# Patient Record
Sex: Female | Born: 1955 | Race: Black or African American | Hispanic: No | Marital: Married | Smoking: Former smoker
Health system: Southern US, Community
[De-identification: ages and names within clinical notes are randomized; demographics above are authoritative.]

## PROBLEM LIST (undated history)

## (undated) DIAGNOSIS — R0600 Dyspnea, unspecified: Secondary | ICD-10-CM

## (undated) DIAGNOSIS — Z87898 Personal history of other specified conditions: Secondary | ICD-10-CM

## (undated) DIAGNOSIS — Z1211 Encounter for screening for malignant neoplasm of colon: Secondary | ICD-10-CM

## (undated) DIAGNOSIS — M7582 Other shoulder lesions, left shoulder: Secondary | ICD-10-CM

## (undated) HISTORY — DX: Other shoulder lesions, left shoulder: M75.82

## (undated) HISTORY — DX: Encounter for screening for malignant neoplasm of colon: Z12.11

## (undated) HISTORY — PX: APPENDECTOMY: SHX54

## (undated) HISTORY — DX: Personal history of other specified conditions: Z87.898

## (undated) HISTORY — PX: TUBAL LIGATION: SHX77

---

## 2004-09-09 ENCOUNTER — Ambulatory Visit: Payer: Self-pay | Admitting: Endocrinology

## 2004-11-06 ENCOUNTER — Ambulatory Visit: Payer: Self-pay | Admitting: General Practice

## 2005-03-19 ENCOUNTER — Ambulatory Visit: Payer: Self-pay

## 2005-12-09 ENCOUNTER — Ambulatory Visit: Payer: Self-pay | Admitting: General Practice

## 2006-01-10 ENCOUNTER — Emergency Department: Payer: Self-pay | Admitting: Internal Medicine

## 2010-08-21 ENCOUNTER — Ambulatory Visit: Payer: Self-pay | Admitting: General Practice

## 2011-03-27 ENCOUNTER — Emergency Department: Payer: Self-pay | Admitting: Emergency Medicine

## 2011-08-26 ENCOUNTER — Ambulatory Visit: Payer: Self-pay | Admitting: General Practice

## 2011-08-28 ENCOUNTER — Ambulatory Visit: Payer: Self-pay | Admitting: General Practice

## 2012-09-15 ENCOUNTER — Ambulatory Visit: Payer: Self-pay | Admitting: Medical

## 2013-03-02 ENCOUNTER — Ambulatory Visit: Payer: Self-pay | Admitting: Medical

## 2013-09-21 ENCOUNTER — Ambulatory Visit: Payer: Self-pay | Admitting: Medical

## 2014-09-27 ENCOUNTER — Ambulatory Visit: Admit: 2014-09-27 | Disposition: A | Discharge status: Home / Self Care | Payer: Self-pay | Admitting: Medical

## 2015-10-18 DIAGNOSIS — Z87898 Personal history of other specified conditions: Secondary | ICD-10-CM

## 2015-10-18 HISTORY — DX: Personal history of other specified conditions: Z87.898

## 2016-09-18 ENCOUNTER — Other Ambulatory Visit: Payer: Self-pay

## 2016-09-18 DIAGNOSIS — Z0000 Encounter for general adult medical examination without abnormal findings: Secondary | ICD-10-CM

## 2016-09-19 LAB — CMP12+LP+TP+TSH+6AC+CBC/D/PLT
ALBUMIN: 4.2 g/dL (ref 3.6–4.8)
ALK PHOS: 94 IU/L (ref 39–117)
ALT: 13 IU/L (ref 0–32)
AST: 15 IU/L (ref 0–40)
Albumin/Globulin Ratio: 1.6 (ref 1.2–2.2)
BASOS: 1 %
BUN/Creatinine Ratio: 30 — ABNORMAL HIGH (ref 12–28)
BUN: 22 mg/dL (ref 8–27)
Basophils Absolute: 0 10*3/uL (ref 0.0–0.2)
Bilirubin Total: <0.2 mg/dL (ref 0.0–1.2)
CALCIUM: 9.8 mg/dL (ref 8.7–10.3)
CHLORIDE: 102 mmol/L (ref 96–106)
CHOL/HDL RATIO: 4.4 ratio (ref 0.0–4.4)
CHOLESTEROL TOTAL: 201 mg/dL — AB (ref 100–199)
Creatinine, Ser: 0.73 mg/dL (ref 0.57–1.00)
EOS (ABSOLUTE): 0.1 10*3/uL (ref 0.0–0.4)
ESTIMATED CHD RISK: 1 times avg. (ref 0.0–1.0)
Eos: 2 %
FREE THYROXINE INDEX: 3.3 (ref 1.2–4.9)
GFR calc Af Amer: 104 mL/min/{1.73_m2} (ref >59)
GFR calc non Af Amer: 90 mL/min/{1.73_m2} (ref >59)
GGT: 13 IU/L (ref 0–60)
Globulin, Total: 2.7 g/dL (ref 1.5–4.5)
Glucose: 87 mg/dL (ref 65–99)
HDL: 46 mg/dL (ref >39)
HEMATOCRIT: 46.9 % — AB (ref 34.0–46.6)
Hemoglobin: 15.9 g/dL (ref 11.1–15.9)
IMMATURE GRANULOCYTES: 0 %
Immature Grans (Abs): 0 10*3/uL (ref 0.0–0.1)
Iron: 81 ug/dL (ref 27–159)
LDH: 241 IU/L — ABNORMAL HIGH (ref 119–226)
LDL Calculated: 134 mg/dL — ABNORMAL HIGH (ref 0–99)
LYMPHS ABS: 2.6 10*3/uL (ref 0.7–3.1)
Lymphs: 39 %
MCH: 28.4 pg (ref 26.6–33.0)
MCHC: 33.9 g/dL (ref 31.5–35.7)
MCV: 84 fL (ref 79–97)
MONOS ABS: 0.5 10*3/uL (ref 0.1–0.9)
Monocytes: 7 %
NEUTROS PCT: 51 %
Neutrophils Absolute: 3.5 10*3/uL (ref 1.4–7.0)
PLATELETS: 237 10*3/uL (ref 150–379)
POTASSIUM: 5.1 mmol/L (ref 3.5–5.2)
Phosphorus: 4.5 mg/dL (ref 2.5–4.5)
RBC: 5.6 x10E6/uL — ABNORMAL HIGH (ref 3.77–5.28)
RDW: 15.1 % (ref 12.3–15.4)
SODIUM: 144 mmol/L (ref 134–144)
T3 UPTAKE RATIO: 30 % (ref 24–39)
T4, Total: 11 ug/dL (ref 4.5–12.0)
TSH: 0.943 u[IU]/mL (ref 0.450–4.500)
Total Protein: 6.9 g/dL (ref 6.0–8.5)
Triglycerides: 103 mg/dL (ref 0–149)
URIC ACID: 7.2 mg/dL — AB (ref 2.5–7.1)
VLDL Cholesterol Cal: 21 mg/dL (ref 5–40)
WBC: 6.8 10*3/uL (ref 3.4–10.8)

## 2016-09-19 LAB — VITAMIN D 25 HYDROXY (VIT D DEFICIENCY, FRACTURES): Vit D, 25-Hydroxy: 17 ng/mL — ABNORMAL LOW (ref 30.0–100.0)

## 2016-09-25 ENCOUNTER — Encounter: Payer: Self-pay | Admitting: Medical

## 2016-09-25 ENCOUNTER — Ambulatory Visit: Payer: Self-pay | Admitting: Medical

## 2016-09-25 VITALS — BP 110/72 | HR 87 | Temp 98.7°F | Resp 16 | Ht 67.0 in | Wt 174.0 lb

## 2016-09-25 DIAGNOSIS — M7582 Other shoulder lesions, left shoulder: Secondary | ICD-10-CM

## 2016-09-25 DIAGNOSIS — M778 Other enthesopathies, not elsewhere classified: Secondary | ICD-10-CM

## 2016-09-25 DIAGNOSIS — Z0000 Encounter for general adult medical examination without abnormal findings: Secondary | ICD-10-CM

## 2016-09-25 DIAGNOSIS — M79652 Pain in left thigh: Secondary | ICD-10-CM

## 2016-09-25 NOTE — Progress Notes (Signed)
Patient is here for annual physical to get established as a primary care patient.  Review labs.  Left leg upper thigh/groin area is catching which is hurting.  It has been going on for a few months.  OTC Aleve.

## 2016-09-25 NOTE — Progress Notes (Signed)
   Subjective:    Patient ID: Cindy Yang, female    DOB: 1956-01-15, 61 y.o.   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 Primary care visit.    Review of Systems  Constitutional: Negative.   HENT: Positive for sneezing. Negative for congestion, dental problem, drooling, ear discharge, ear pain, facial swelling, hearing loss, mouth sores, nosebleeds, postnasal drip, rhinorrhea, sinus pain, sinus pressure, sore throat, tinnitus, trouble swallowing and voice change.   Eyes: Negative.   Respiratory: Negative.   Cardiovascular: Negative.   Gastrointestinal: Negative.   Endocrine: Negative.   Genitourinary: Negative.   Musculoskeletal: Negative.   Skin: Negative.   Allergic/Immunologic: Negative.   Neurological: Negative.   Hematological: Negative.   Psychiatric/Behavioral: Negative.     History of left shoulder tendonitis with cortisone injections and physical therapy in the past.with little relief. Before thanksgiving started having left thigh pain,  Only hurts while climbing/walking up steps, nothing makes it feel better, aleve helps some.    Objective:   Physical Exam  Constitutional: She is oriented to person, place, and time. She appears well-developed and well-nourished.  HENT:  Head: Normocephalic and atraumatic.  Right Ear: External ear normal.  Left Ear: External ear normal.  Nose: Nose normal.  Mouth/Throat: Oropharynx is clear and moist.  Eyes: Conjunctivae and EOM are normal. Pupils are equal, round, and reactive to light. No scleral icterus.  Neck: Normal range of motion. Neck supple. No thyromegaly present.  Cardiovascular: Normal rate, regular rhythm and normal heart sounds.  Exam reveals no gallop and no friction rub.   No murmur heard. Pulmonary/Chest: Effort normal and breath sounds normal. She exhibits no mass and no tenderness.  Abdominal: Soft. Normal appearance, normal aorta and bowel sounds are normal. She exhibits no  abdominal bruit. There is no hepatosplenomegaly. There is no tenderness.  Musculoskeletal: She exhibits no edema, tenderness or deformity.  Lymphadenopathy:    She has no cervical adenopathy.  Neurological: She is alert and oriented to person, place, and time.  Skin: Skin is warm and dry.  Psychiatric: She has a normal mood and affect. Her behavior is normal.  Nursing note and vitals reviewed.  Arcus senilis bilateral  Breast exam no discharge, non-tender, no lumps or masses palpated, no adenopathy. can internal and external rotate left hip, denies pain, hurts on flexion or going up steps. Decreased pulses of 1-2 + on DP. PT 2 + bilaterally.    Assessment & Plan:  Primary exam, Left thigh pain ordered xray of left Femur 2 view.  Called patient  10/07/2015 she had not gone yet for the xray, she is going tonight. Elevated LDH 241 Elevated cholesterol 201 Elevated LDL 134 Vitamin  D deficiency 17.0, take OTC Vitamin D3 4000IU/day recheck in one year.  Return to clinic in 4 weeks for recheck.

## 2016-09-29 ENCOUNTER — Other Ambulatory Visit: Payer: Self-pay | Admitting: Medical

## 2016-09-29 DIAGNOSIS — Z1231 Encounter for screening mammogram for malignant neoplasm of breast: Secondary | ICD-10-CM

## 2016-10-01 ENCOUNTER — Encounter: Payer: Self-pay | Admitting: Radiology

## 2016-10-01 ENCOUNTER — Ambulatory Visit
Admission: RE | Admit: 2016-10-01 | Discharge: 2016-10-01 | Disposition: A | Discharge status: Home / Self Care | Payer: BLUE CROSS/BLUE SHIELD | Source: Ambulatory Visit | Attending: Medical | Admitting: Medical

## 2016-10-01 DIAGNOSIS — Z1231 Encounter for screening mammogram for malignant neoplasm of breast: Secondary | ICD-10-CM | POA: Diagnosis present

## 2016-10-06 ENCOUNTER — Ambulatory Visit
Admission: RE | Admit: 2016-10-06 | Discharge: 2016-10-06 | Disposition: A | Discharge status: Home / Self Care | Payer: BLUE CROSS/BLUE SHIELD | Source: Ambulatory Visit | Attending: Medical | Admitting: Medical

## 2016-10-06 ENCOUNTER — Encounter: Payer: Self-pay | Admitting: Medical

## 2016-10-06 DIAGNOSIS — M1612 Unilateral primary osteoarthritis, left hip: Secondary | ICD-10-CM | POA: Diagnosis not present

## 2016-10-06 DIAGNOSIS — M5137 Other intervertebral disc degeneration, lumbosacral region: Secondary | ICD-10-CM | POA: Insufficient documentation

## 2016-10-06 DIAGNOSIS — M79652 Pain in left thigh: Secondary | ICD-10-CM

## 2016-10-07 ENCOUNTER — Encounter: Payer: Self-pay | Admitting: Medical

## 2016-10-07 ENCOUNTER — Telehealth: Payer: Self-pay | Admitting: Medical

## 2016-10-07 DIAGNOSIS — M778 Other enthesopathies, not elsewhere classified: Secondary | ICD-10-CM

## 2016-10-07 DIAGNOSIS — M7582 Other shoulder lesions, left shoulder: Secondary | ICD-10-CM

## 2016-10-07 HISTORY — DX: Other enthesopathies, not elsewhere classified: M77.8

## 2016-10-07 NOTE — Telephone Encounter (Signed)
Called patient on her left  thigh ( based off patient description of pain) bone xray,  Showed  severe degenerative changes noted in the  Left hip . Also reviewed mammogram results negative, with recheck in one year.  Communicated to patient will refer to orthopedics about left  hip changes.

## 2016-12-04 ENCOUNTER — Telehealth: Payer: Self-pay

## 2016-12-04 NOTE — Telephone Encounter (Signed)
Cindy Yang called to let us know she will have a left hip replacement July 3,2018. Dr. Rudene Christians will be performing the surgery. Discussed FMLA and instructed to have Dr. Rudene Christians office complete it.

## 2016-12-10 ENCOUNTER — Encounter
Admission: RE | Admit: 2016-12-10 | Discharge: 2016-12-10 | Disposition: A | Discharge status: Home / Self Care | Payer: BLUE CROSS/BLUE SHIELD | Source: Ambulatory Visit | Attending: Orthopedic Surgery | Admitting: Orthopedic Surgery

## 2016-12-10 DIAGNOSIS — Z01818 Encounter for other preprocedural examination: Secondary | ICD-10-CM | POA: Insufficient documentation

## 2016-12-10 DIAGNOSIS — M1612 Unilateral primary osteoarthritis, left hip: Secondary | ICD-10-CM | POA: Diagnosis not present

## 2016-12-10 DIAGNOSIS — F17200 Nicotine dependence, unspecified, uncomplicated: Secondary | ICD-10-CM | POA: Insufficient documentation

## 2016-12-10 DIAGNOSIS — Z0183 Encounter for blood typing: Secondary | ICD-10-CM | POA: Diagnosis not present

## 2016-12-10 DIAGNOSIS — Z01812 Encounter for preprocedural laboratory examination: Secondary | ICD-10-CM | POA: Diagnosis not present

## 2016-12-10 HISTORY — DX: Dyspnea, unspecified: R06.00

## 2016-12-10 LAB — CBC
HCT: 41.5 % (ref 35.0–47.0)
Hemoglobin: 14 g/dL (ref 12.0–16.0)
MCH: 28 pg (ref 26.0–34.0)
MCHC: 33.6 g/dL (ref 32.0–36.0)
MCV: 83.2 fL (ref 80.0–100.0)
PLATELETS: 174 10*3/uL (ref 150–440)
RBC: 4.99 MIL/uL (ref 3.80–5.20)
RDW: 15.3 % — ABNORMAL HIGH (ref 11.5–14.5)
WBC: 6.5 10*3/uL (ref 3.6–11.0)

## 2016-12-10 LAB — BASIC METABOLIC PANEL
Anion gap: 6 (ref 5–15)
BUN: 13 mg/dL (ref 6–20)
CALCIUM: 8.7 mg/dL — AB (ref 8.9–10.3)
CO2: 26 mmol/L (ref 22–32)
Chloride: 105 mmol/L (ref 101–111)
Creatinine, Ser: 0.63 mg/dL (ref 0.44–1.00)
GFR calc Af Amer: >60 mL/min (ref >60)
GFR calc non Af Amer: >60 mL/min (ref >60)
GLUCOSE: 89 mg/dL (ref 65–99)
Potassium: 3.3 mmol/L — ABNORMAL LOW (ref 3.5–5.1)
Sodium: 137 mmol/L (ref 135–145)

## 2016-12-10 LAB — PROTIME-INR
INR: 0.93
Prothrombin Time: 12.5 seconds (ref 11.4–15.2)

## 2016-12-10 LAB — TYPE AND SCREEN
ABO/RH(D): B POS
Antibody Screen: NEGATIVE

## 2016-12-10 LAB — URINALYSIS, COMPLETE (UACMP) WITH MICROSCOPIC
BACTERIA UA: NONE SEEN
BILIRUBIN URINE: NEGATIVE
Glucose, UA: NEGATIVE mg/dL
Hgb urine dipstick: NEGATIVE
KETONES UR: NEGATIVE mg/dL
LEUKOCYTES UA: NEGATIVE
Nitrite: NEGATIVE
PH: 5 (ref 5.0–8.0)
Protein, ur: NEGATIVE mg/dL
RBC / HPF: NONE SEEN RBC/hpf (ref 0–5)
Specific Gravity, Urine: 1.019 (ref 1.005–1.030)

## 2016-12-10 LAB — SEDIMENTATION RATE: Sed Rate: 8 mm/hr (ref 0–30)

## 2016-12-10 LAB — SURGICAL PCR SCREEN
MRSA, PCR: NEGATIVE
STAPHYLOCOCCUS AUREUS: NEGATIVE

## 2016-12-10 LAB — APTT: aPTT: 33 seconds (ref 24–36)

## 2016-12-10 NOTE — Patient Instructions (Signed)
Your procedure is scheduled on: 12/16/16 Tues Report to Same Day Surgery 2nd floor medical mall [facility] Entrance-take elevator on left to 2nd floor.  Check in with surgery information desk.) To find out your arrival time please call [phone] between 1PM - 3PM on 12/15/16 Mon  Remember: Instructions that are not followed completely may result in serious medical risk, up to and including death, or upon the discretion of your surgeon and anesthesiologist your surgery may need to be rescheduled.    _x___ 1. Do not eat food or drink liquids after midnight. No gum chewing or                              hard candies.     __x__ 2. No Alcohol for 24 hours before or after surgery.   __x__3. No Smoking for 24 prior to surgery.   ____  4. Bring all medications with you on the day of surgery if instructed.    __x__ 5. Notify your doctor if there is any change in your medical condition     (cold, fever, infections).     Do not wear jewelry, make-up, hairpins, clips or nail polish.  Do not wear lotions, powders, or perfumes. You may wear deodorant.  Do not shave 48 hours prior to surgery. Men may shave face and neck.  Do not bring valuables to the hospital.    [facility] is not responsible for any belongings or valuables.               Contacts, dentures or bridgework may not be worn into surgery.  Leave your suitcase in the car. After surgery it may be brought to your room.  For patients admitted to the hospital, discharge time is determined by your                       treatment team.   Patients discharged the day of surgery will not be allowed to drive home.  You will need someone to drive you home and stay with you the night of your procedure.    Please read over the following fact sheets that you were given:   [facility] Preparing for Surgery and or MRSA Information   _x___ Take anti-hypertensive (unless it includes a diuretic), cardiac, seizure, asthma,     anti-reflux and  psychiatric medicines. These include:  1. None  2.  3.  4.  5.  6.  ____Fleets enema or Magnesium Citrate as directed.   _x___ Use CHG Soap or sage wipes as directed on instruction sheet   ____ Use inhalers on the day of surgery and bring to hospital day of surgery  ____ Stop Metformin and Janumet 2 days prior to surgery.    ____ Take 1/2 of usual insulin dose the night before surgery and none on the morning     surgery.   _x___ Follow recommendations from Cardiologist, Pulmonologist or PCP regarding          stopping Aspirin, Coumadin, Pllavix ,Eliquis, Effient, or Pradaxa, and Pletal.  X____Stop Anti-inflammatories such as Advil, Aleve, Ibuprofen, Motrin, Naproxen, Naprosyn, Goodies powders or aspirin products. OK to take Tylenol and                          Celebrex.   _x___ Stop supplements until after surgery.  But may continue Vitamin D, Vitamin B,  and multivitamin.   ____ Bring C-Pap to the hospital.

## 2016-12-11 LAB — URINE CULTURE

## 2016-12-11 NOTE — Pre-Procedure Instructions (Signed)
KT 3.3 CALLED AND FAXED TO DR [facility]

## 2016-12-15 MED ORDER — CEFAZOLIN SODIUM-DEXTROSE 2-4 GM/100ML-% IV SOLN
2.0000 g | Freq: Once | INTRAVENOUS | Status: AC
  Administered 2016-12-16: 2 g via INTRAVENOUS

## 2016-12-15 MED ORDER — SODIUM CHLORIDE 0.9 % IV SOLN
1000.0000 mg | INTRAVENOUS | Status: AC
  Administered 2016-12-16: 1000 mg via INTRAVENOUS
  Filled 2016-12-15: qty 10

## 2016-12-16 ENCOUNTER — Inpatient Hospital Stay: Payer: BLUE CROSS/BLUE SHIELD | Admitting: Certified Registered"

## 2016-12-16 ENCOUNTER — Encounter
Admission: RE | Disposition: A | Discharge status: Home Health | Payer: Self-pay | Source: Ambulatory Visit | Attending: Orthopedic Surgery

## 2016-12-16 ENCOUNTER — Encounter: Payer: Self-pay | Admitting: *Deleted

## 2016-12-16 ENCOUNTER — Inpatient Hospital Stay: Payer: BLUE CROSS/BLUE SHIELD

## 2016-12-16 ENCOUNTER — Inpatient Hospital Stay
Admission: RE | Admit: 2016-12-16 | Discharge: 2016-12-19 | DRG: 470 | Disposition: A | Discharge status: Home Health | Payer: BLUE CROSS/BLUE SHIELD | Source: Ambulatory Visit | Attending: Orthopedic Surgery | Admitting: Orthopedic Surgery

## 2016-12-16 DIAGNOSIS — Z820 Family history of epilepsy and other diseases of the nervous system: Secondary | ICD-10-CM | POA: Diagnosis not present

## 2016-12-16 DIAGNOSIS — Z8249 Family history of ischemic heart disease and other diseases of the circulatory system: Secondary | ICD-10-CM

## 2016-12-16 DIAGNOSIS — M25552 Pain in left hip: Secondary | ICD-10-CM | POA: Diagnosis present

## 2016-12-16 DIAGNOSIS — Z801 Family history of malignant neoplasm of trachea, bronchus and lung: Secondary | ICD-10-CM | POA: Diagnosis not present

## 2016-12-16 DIAGNOSIS — R7303 Prediabetes: Secondary | ICD-10-CM | POA: Diagnosis present

## 2016-12-16 DIAGNOSIS — Z419 Encounter for procedure for purposes other than remedying health state, unspecified: Secondary | ICD-10-CM

## 2016-12-16 DIAGNOSIS — M1612 Unilateral primary osteoarthritis, left hip: Principal | ICD-10-CM | POA: Diagnosis present

## 2016-12-16 DIAGNOSIS — G8918 Other acute postprocedural pain: Secondary | ICD-10-CM

## 2016-12-16 HISTORY — PX: TOTAL HIP ARTHROPLASTY: SHX124

## 2016-12-16 LAB — CBC
HCT: 43 % (ref 35.0–47.0)
HEMOGLOBIN: 14.2 g/dL (ref 12.0–16.0)
MCH: 27.6 pg (ref 26.0–34.0)
MCHC: 33 g/dL (ref 32.0–36.0)
MCV: 83.7 fL (ref 80.0–100.0)
PLATELETS: 169 10*3/uL (ref 150–440)
RBC: 5.13 MIL/uL (ref 3.80–5.20)
RDW: 15.2 % — ABNORMAL HIGH (ref 11.5–14.5)
WBC: 8.6 10*3/uL (ref 3.6–11.0)

## 2016-12-16 LAB — CREATININE, SERUM
Creatinine, Ser: 0.76 mg/dL (ref 0.44–1.00)
GFR calc Af Amer: >60 mL/min (ref >60)

## 2016-12-16 LAB — POCT I-STAT 4, (NA,K, GLUC, HGB,HCT)
Glucose, Bld: 94 mg/dL (ref 65–99)
HEMATOCRIT: 47 % — AB (ref 36.0–46.0)
Hemoglobin: 16 g/dL — ABNORMAL HIGH (ref 12.0–15.0)
Potassium: 4 mmol/L (ref 3.5–5.1)
Sodium: 142 mmol/L (ref 135–145)

## 2016-12-16 LAB — ABO/RH: ABO/RH(D): B POS

## 2016-12-16 SURGERY — ARTHROPLASTY, HIP, TOTAL, ANTERIOR APPROACH
Anesthesia: Spinal | Site: Hip | Laterality: Left | Wound class: Clean

## 2016-12-16 MED ORDER — BUPIVACAINE-EPINEPHRINE 0.25% -1:200000 IJ SOLN
INTRAMUSCULAR | Status: DC | PRN
  Administered 2016-12-16: 30 mL

## 2016-12-16 MED ORDER — ALUM & MAG HYDROXIDE-SIMETH 200-200-20 MG/5ML PO SUSP: 30.0000 mL | ORAL | Status: DC | PRN

## 2016-12-16 MED ORDER — ACETAMINOPHEN 325 MG PO TABS: 650.0000 mg | ORAL_TABLET | Freq: Four times a day (QID) | ORAL | Status: DC | PRN

## 2016-12-16 MED ORDER — MENTHOL 3 MG MT LOZG
1.0000 | LOZENGE | OROMUCOSAL | Status: DC | PRN
  Filled 2016-12-16: qty 9

## 2016-12-16 MED ORDER — PHENYLEPHRINE HCL 10 MG/ML IJ SOLN
INTRAMUSCULAR | Status: DC | PRN
  Administered 2016-12-16 (×2): 100 ug via INTRAVENOUS

## 2016-12-16 MED ORDER — ACETAMINOPHEN 650 MG RE SUPP: 650.0000 mg | Freq: Four times a day (QID) | RECTAL | Status: DC | PRN

## 2016-12-16 MED ORDER — FENTANYL CITRATE (PF) 100 MCG/2ML IJ SOLN: 25.0000 ug | INTRAMUSCULAR | Status: DC | PRN

## 2016-12-16 MED ORDER — PHENOL 1.4 % MT LIQD
1.0000 | OROMUCOSAL | Status: DC | PRN
  Filled 2016-12-16: qty 177

## 2016-12-16 MED ORDER — MORPHINE SULFATE (PF) 2 MG/ML IV SOLN
2.0000 mg | INTRAVENOUS | Status: DC | PRN
  Administered 2016-12-17: 2 mg via INTRAVENOUS
  Filled 2016-12-16: qty 1

## 2016-12-16 MED ORDER — MAGNESIUM CITRATE PO SOLN
1.0000 | Freq: Once | ORAL | Status: AC | PRN
  Administered 2016-12-19: 1 via ORAL
  Filled 2016-12-16 (×2): qty 296

## 2016-12-16 MED ORDER — MIDAZOLAM HCL 5 MG/5ML IJ SOLN
INTRAMUSCULAR | Status: DC | PRN
  Administered 2016-12-16: 2 mg via INTRAVENOUS

## 2016-12-16 MED ORDER — METOCLOPRAMIDE HCL 10 MG PO TABS: 5.0000 mg | ORAL_TABLET | Freq: Three times a day (TID) | ORAL | Status: DC | PRN

## 2016-12-16 MED ORDER — BUPIVACAINE HCL (PF) 0.5 % IJ SOLN
INTRAMUSCULAR | Status: AC
  Filled 2016-12-16: qty 10

## 2016-12-16 MED ORDER — DOCUSATE SODIUM 100 MG PO CAPS
100.0000 mg | ORAL_CAPSULE | Freq: Two times a day (BID) | ORAL | Status: DC
  Administered 2016-12-17 – 2016-12-19 (×5): 100 mg via ORAL
  Filled 2016-12-16 (×5): qty 1

## 2016-12-16 MED ORDER — METHOCARBAMOL 500 MG PO TABS
500.0000 mg | ORAL_TABLET | Freq: Four times a day (QID) | ORAL | Status: DC | PRN
Start: 2016-12-16 — End: 2016-12-19

## 2016-12-16 MED ORDER — BUPIVACAINE HCL (PF) 0.5 % IJ SOLN
INTRAMUSCULAR | Status: DC | PRN
  Administered 2016-12-16: 3 mL via INTRATHECAL

## 2016-12-16 MED ORDER — SODIUM CHLORIDE 0.9 % IV SOLN
INTRAVENOUS | Status: DC
  Administered 2016-12-16 – 2016-12-17 (×2): via INTRAVENOUS

## 2016-12-16 MED ORDER — GLYCOPYRROLATE 0.2 MG/ML IJ SOLN
INTRAMUSCULAR | Status: DC | PRN
  Administered 2016-12-16: 0.2 mg via INTRAVENOUS

## 2016-12-16 MED ORDER — NEOMYCIN-POLYMYXIN B GU 40-200000 IR SOLN
Status: DC | PRN
  Administered 2016-12-16: 4 mL

## 2016-12-16 MED ORDER — DEXTROSE 5 % IV SOLN
500.0000 mg | Freq: Four times a day (QID) | INTRAVENOUS | Status: DC | PRN
  Filled 2016-12-16: qty 5

## 2016-12-16 MED ORDER — SODIUM CHLORIDE 0.9 % IV SOLN
INTRAVENOUS | Status: DC | PRN
  Administered 2016-12-16: 35 ug/min via INTRAVENOUS

## 2016-12-16 MED ORDER — DIPHENHYDRAMINE HCL 12.5 MG/5ML PO ELIX: 12.5000 mg | ORAL_SOLUTION | ORAL | Status: DC | PRN

## 2016-12-16 MED ORDER — KETAMINE HCL 10 MG/ML IJ SOLN
INTRAMUSCULAR | Status: DC | PRN
  Administered 2016-12-16: 25 mg via INTRAVENOUS

## 2016-12-16 MED ORDER — ONDANSETRON HCL 4 MG/2ML IJ SOLN: 4.0000 mg | Freq: Once | INTRAMUSCULAR | Status: DC | PRN

## 2016-12-16 MED ORDER — ONDANSETRON HCL 4 MG/2ML IJ SOLN
4.0000 mg | Freq: Four times a day (QID) | INTRAMUSCULAR | Status: DC | PRN
  Administered 2016-12-16 – 2016-12-17 (×2): 4 mg via INTRAVENOUS
  Filled 2016-12-16 (×2): qty 2

## 2016-12-16 MED ORDER — FENTANYL CITRATE (PF) 100 MCG/2ML IJ SOLN
INTRAMUSCULAR | Status: DC | PRN
  Administered 2016-12-16: 50 ug via INTRAVENOUS

## 2016-12-16 MED ORDER — PROPOFOL 500 MG/50ML IV EMUL
INTRAVENOUS | Status: DC | PRN
  Administered 2016-12-16: 50 ug/kg/min via INTRAVENOUS

## 2016-12-16 MED ORDER — MIDAZOLAM HCL 2 MG/2ML IJ SOLN
INTRAMUSCULAR | Status: AC
Start: 2016-12-16 — End: 2016-12-16
  Filled 2016-12-16: qty 2

## 2016-12-16 MED ORDER — PROPOFOL 500 MG/50ML IV EMUL
INTRAVENOUS | Status: AC
  Filled 2016-12-16: qty 50

## 2016-12-16 MED ORDER — ZOLPIDEM TARTRATE 5 MG PO TABS: 5.0000 mg | ORAL_TABLET | Freq: Every evening | ORAL | Status: DC | PRN

## 2016-12-16 MED ORDER — METOCLOPRAMIDE HCL 5 MG/ML IJ SOLN
5.0000 mg | Freq: Three times a day (TID) | INTRAMUSCULAR | Status: DC | PRN
Start: 2016-12-16 — End: 2016-12-19

## 2016-12-16 MED ORDER — GLYCOPYRROLATE 0.2 MG/ML IJ SOLN
INTRAMUSCULAR | Status: AC
  Filled 2016-12-16: qty 1

## 2016-12-16 MED ORDER — LIDOCAINE HCL (PF) 2 % IJ SOLN
INTRAMUSCULAR | Status: AC
  Filled 2016-12-16: qty 2

## 2016-12-16 MED ORDER — ONDANSETRON HCL 4 MG PO TABS
4.0000 mg | ORAL_TABLET | Freq: Four times a day (QID) | ORAL | Status: DC | PRN
  Administered 2016-12-18: 4 mg via ORAL
  Filled 2016-12-16: qty 1

## 2016-12-16 MED ORDER — LACTATED RINGERS IV SOLN
INTRAVENOUS | Status: DC
  Administered 2016-12-16: 11:00:00 via INTRAVENOUS

## 2016-12-16 MED ORDER — CEFAZOLIN SODIUM-DEXTROSE 2-4 GM/100ML-% IV SOLN
2.0000 g | Freq: Four times a day (QID) | INTRAVENOUS | Status: AC
  Administered 2016-12-16 – 2016-12-17 (×3): 2 g via INTRAVENOUS
  Filled 2016-12-16 (×3): qty 100

## 2016-12-16 MED ORDER — PROPOFOL 10 MG/ML IV BOLUS
INTRAVENOUS | Status: DC | PRN
  Administered 2016-12-16: 50 mg via INTRAVENOUS

## 2016-12-16 MED ORDER — OXYCODONE HCL 5 MG PO TABS
5.0000 mg | ORAL_TABLET | ORAL | Status: DC | PRN
  Administered 2016-12-16: 10 mg via ORAL
  Administered 2016-12-16: 5 mg via ORAL
  Administered 2016-12-16 – 2016-12-17 (×2): 10 mg via ORAL
  Administered 2016-12-17 (×2): 5 mg via ORAL
  Administered 2016-12-17: 10 mg via ORAL
  Administered 2016-12-17 – 2016-12-19 (×6): 5 mg via ORAL
  Filled 2016-12-16 (×7): qty 1
  Filled 2016-12-16: qty 2
  Filled 2016-12-16 (×2): qty 1
  Filled 2016-12-16 (×3): qty 2

## 2016-12-16 MED ORDER — LIDOCAINE HCL (PF) 2 % IJ SOLN
INTRAMUSCULAR | Status: DC | PRN
  Administered 2016-12-16: 50 mg

## 2016-12-16 MED ORDER — ENOXAPARIN SODIUM 40 MG/0.4ML ~~LOC~~ SOLN
40.0000 mg | SUBCUTANEOUS | Status: DC
  Administered 2016-12-17 – 2016-12-19 (×3): 40 mg via SUBCUTANEOUS
  Filled 2016-12-16 (×3): qty 0.4

## 2016-12-16 MED ORDER — PROPOFOL 10 MG/ML IV BOLUS
INTRAVENOUS | Status: AC
  Filled 2016-12-16: qty 20

## 2016-12-16 MED ORDER — FENTANYL CITRATE (PF) 100 MCG/2ML IJ SOLN
INTRAMUSCULAR | Status: AC
  Filled 2016-12-16: qty 2

## 2016-12-16 MED ORDER — BISACODYL 10 MG RE SUPP
10.0000 mg | Freq: Every day | RECTAL | Status: DC | PRN
Start: 2016-12-16 — End: 2016-12-19
  Administered 2016-12-19: 10 mg via RECTAL
  Filled 2016-12-16: qty 1

## 2016-12-16 MED ORDER — FAMOTIDINE 20 MG PO TABS
20.0000 mg | ORAL_TABLET | Freq: Once | ORAL | Status: AC
  Administered 2016-12-16: 20 mg via ORAL

## 2016-12-16 MED ORDER — MAGNESIUM HYDROXIDE 400 MG/5ML PO SUSP
30.0000 mL | Freq: Every day | ORAL | Status: DC | PRN
  Administered 2016-12-17 – 2016-12-18 (×2): 30 mL via ORAL
  Filled 2016-12-16 (×2): qty 30

## 2016-12-16 SURGICAL SUPPLY — 50 items
BLADE SAW SAG 18.5X105 (BLADE) ×3 IMPLANT
BNDG COHESIVE 6X5 TAN STRL LF (GAUZE/BANDAGES/DRESSINGS) ×9 IMPLANT
CANISTER SUCT 1200ML W/VALVE (MISCELLANEOUS) ×3 IMPLANT
CAPT HIP TOTAL 3 ×3 IMPLANT
CATH FOL LEG HOLDER (MISCELLANEOUS) ×3 IMPLANT
CATH TRAY METER 16FR LF (MISCELLANEOUS) ×3 IMPLANT
CHLORAPREP W/TINT 26ML (MISCELLANEOUS) ×3 IMPLANT
DRAPE C-ARM XRAY 36X54 (DRAPES) ×3 IMPLANT
DRAPE INCISE IOBAN 66X60 STRL (DRAPES) IMPLANT
DRAPE POUCH INSTRU U-SHP 10X18 (DRAPES) ×3 IMPLANT
DRAPE SHEET LG 3/4 BI-LAMINATE (DRAPES) ×9 IMPLANT
DRAPE TABLE BACK 80X90 (DRAPES) ×3 IMPLANT
DRESSING SURGICEL FIBRLLR 1X2 (HEMOSTASIS) ×2 IMPLANT
DRSG OPSITE POSTOP 4X8 (GAUZE/BANDAGES/DRESSINGS) ×6 IMPLANT
DRSG SURGICEL FIBRILLAR 1X2 (HEMOSTASIS) ×6
ELECT BLADE 6.5 EXT (BLADE) ×3 IMPLANT
ELECT REM PT RETURN 9FT ADLT (ELECTROSURGICAL) ×3
ELECTRODE REM PT RTRN 9FT ADLT (ELECTROSURGICAL) ×1 IMPLANT
GLOVE BIOGEL PI IND STRL 9 (GLOVE) ×1 IMPLANT
GLOVE BIOGEL PI INDICATOR 9 (GLOVE) ×2
GLOVE SURG SYN 9.0  PF PI (GLOVE) ×4
GLOVE SURG SYN 9.0 PF PI (GLOVE) ×2 IMPLANT
GOWN SRG 2XL LVL 4 RGLN SLV (GOWNS) ×1 IMPLANT
GOWN STRL NON-REIN 2XL LVL4 (GOWNS) ×2
GOWN STRL REUS W/ TWL LRG LVL3 (GOWN DISPOSABLE) ×1 IMPLANT
GOWN STRL REUS W/TWL LRG LVL3 (GOWN DISPOSABLE) ×2
HEMOVAC 400CC 10FR (MISCELLANEOUS) ×3 IMPLANT
HOOD PEEL AWAY FLYTE STAYCOOL (MISCELLANEOUS) ×3 IMPLANT
KIT PREVENA INCISION MGT 13 (CANNISTER) ×3 IMPLANT
MAT BLUE FLOOR 46X72 FLO (MISCELLANEOUS) ×3 IMPLANT
NDL SAFETY 18GX1.5 (NEEDLE) ×3 IMPLANT
NEEDLE SPNL 18GX3.5 QUINCKE PK (NEEDLE) ×3 IMPLANT
NS IRRIG 1000ML POUR BTL (IV SOLUTION) ×3 IMPLANT
PACK HIP COMPR (MISCELLANEOUS) ×3 IMPLANT
SOL PREP PVP 2OZ (MISCELLANEOUS) ×3
SOLUTION PREP PVP 2OZ (MISCELLANEOUS) ×1 IMPLANT
SPONGE DRAIN TRACH 4X4 STRL 2S (GAUZE/BANDAGES/DRESSINGS) ×3 IMPLANT
STAPLER SKIN PROX 35W (STAPLE) ×3 IMPLANT
STRAP SAFETY BODY (MISCELLANEOUS) ×3 IMPLANT
SUT DVC 2 QUILL PDO  T11 36X36 (SUTURE) ×2
SUT DVC 2 QUILL PDO T11 36X36 (SUTURE) ×1 IMPLANT
SUT SILK 0 (SUTURE) ×2
SUT SILK 0 30XBRD TIE 6 (SUTURE) ×1 IMPLANT
SUT V-LOC 90 ABS DVC 3-0 CL (SUTURE) ×3 IMPLANT
SUT VIC AB 1 CT1 36 (SUTURE) ×3 IMPLANT
SYR 20CC LL (SYRINGE) ×3 IMPLANT
SYR 30ML LL (SYRINGE) ×3 IMPLANT
TAPE MICROFOAM 4IN (TAPE) ×3 IMPLANT
TOWEL OR 17X26 4PK STRL BLUE (TOWEL DISPOSABLE) ×3 IMPLANT
WND VAC CANISTER 500ML (MISCELLANEOUS) ×3 IMPLANT

## 2016-12-16 NOTE — Anesthesia Preprocedure Evaluation (Addendum)
Anesthesia Evaluation  Patient identified by MRN, date of birth, ID band Patient awake    Reviewed: Allergy & Precautions, NPO status , Patient's Chart, lab work & pertinent test results, reviewed documented beta blocker date and time   Airway Mallampati: II  TM Distance: >3 FB     Dental  (+) Chipped, Upper Dentures, Lower Dentures   Pulmonary shortness of breath, Current Smoker,           Cardiovascular      Neuro/Psych    GI/Hepatic   Endo/Other    Renal/GU      Musculoskeletal  (+) Arthritis ,   Abdominal   Peds  Hematology   Anesthesia Other Findings EKG review and ok.  Reproductive/Obstetrics                            Anesthesia Physical Anesthesia Plan  ASA: II  Anesthesia Plan: Spinal   Post-op Pain Management:    Induction:   PONV Risk Score and Plan:   Airway Management Planned:   Additional Equipment:   Intra-op Plan:   Post-operative Plan:   Informed Consent: I have reviewed the patients History and Physical, chart, labs and discussed the procedure including the risks, benefits and alternatives for the proposed anesthesia with the patient or authorized representative who has indicated his/her understanding and acceptance.     Plan Discussed with: CRNA  Anesthesia Plan Comments:         Anesthesia Quick Evaluation

## 2016-12-16 NOTE — Anesthesia Procedure Notes (Signed)
Spinal  Patient location during procedure: OR Staffing Anesthesiologist: Gunnar Bulla Resident/CRNA: Rolla Plate Performed: resident/CRNA  Preanesthetic Checklist Completed: patient identified, site marked, surgical consent, pre-op evaluation, timeout performed, IV checked, risks and benefits discussed and monitors and equipment checked Spinal Block Patient position: sitting Prep: ChloraPrep and site prepped and draped Patient monitoring: heart rate, continuous pulse ox, blood pressure and cardiac monitor Approach: midline Location: L4-5 Injection technique: single-shot Needle Needle type: Introducer and Pencan  Needle gauge: 24 G Needle length: 9 cm Assessment Sensory level: T10 Additional Notes Negative paresthesia. Negative blood return. Positive free-flowing CSF. Expiration date of kit checked and confirmed. Patient tolerated procedure well, without complications.

## 2016-12-16 NOTE — Transfer of Care (Signed)
Immediate Anesthesia Transfer of Care Note  Patient: Cindy Yang  Procedure(s) Performed: Procedure(s): TOTAL HIP ARTHROPLASTY ANTERIOR APPROACH (Left)  Patient Location: PACU  Anesthesia Type:Spinal  Level of Consciousness: awake  Airway & Oxygen Therapy: Patient Spontanous Breathing  Post-op Assessment: Report given to RN and Post -op Vital signs reviewed and stable  Post vital signs: Reviewed  Last Vitals:  Vitals:   12/16/16 1043 12/16/16 1313  BP: 131/68 (!) 91/56  Pulse: 67 65  Resp: 16 12  Temp: 36.9 C (!) 36.3 C    Last Pain:  Vitals:   12/16/16 1043  TempSrc: Oral         Complications: No apparent anesthesia complications

## 2016-12-16 NOTE — H&P (Signed)
Reviewed paper H+P, will be scanned into chart. No changes noted.  

## 2016-12-16 NOTE — NC FL2 (Signed)
[place] LEVEL OF CARE SCREENING TOOL     IDENTIFICATION  Patient Name: Cindy Yang Birthdate: 11-02-1955 Sex: female Admission Date (Current Location): 12/16/2016  [place] and [place] Number:  Engineering geologist and Address:  [facility], [address] [place], [place] [address]      Provider Number: [id number]  Attending Physician Name and Address:  Hessie Knows, MD  Relative Name and Phone Number:       Current Level of Care: Hospital Recommended Level of Care: [place] Prior Approval Number:    Date Approved/Denied:   PASRR Number:  [id number])  Discharge Plan: SNF    Current Diagnoses: Patient Active Problem List   Diagnosis Date Noted  . Primary localized osteoarthritis of left hip 12/16/2016  . Shoulder tendonitis, left 10/07/2016    Orientation RESPIRATION BLADDER Height & Weight     Self, Time, Situation, Place  Normal Continent Weight: 170 lb (77.1 kg) Height:  5' 7.5" (171.5 cm)  BEHAVIORAL SYMPTOMS/MOOD NEUROLOGICAL BOWEL NUTRITION STATUS   (none)  (none) Continent Diet (Regular Diet )  AMBULATORY STATUS COMMUNICATION OF NEEDS Skin   Extensive Assist Verbally Surgical wounds, Wound Vac (Incision: Left Hip, Provena wound vac. )                       Personal Care Assistance Level of Assistance  Bathing, Feeding, Dressing Bathing Assistance: Limited assistance Feeding assistance: Independent Dressing Assistance: Limited assistance     Functional Limitations Info  Sight, Hearing, Speech Sight Info: Adequate Hearing Info: Adequate Speech Info: Adequate    SPECIAL CARE FACTORS FREQUENCY  PT (By licensed PT), OT (By licensed OT)     PT Frequency:  (5) OT Frequency:  (5)            Contractures      Additional Factors Info  Code Status, Allergies Code Status Info:  (Full Code. ) Allergies Info:  (No Known Allergies. )           Current Medications (12/16/2016):   This is the current hospital active medication list Current Facility-Administered Medications  Medication Dose Route Frequency Provider Last Rate Last Dose  . 0.9 %  sodium chloride infusion   Intravenous Continuous Hessie Knows, MD      . acetaminophen (TYLENOL) tablet 650 mg  650 mg Oral Q6H PRN Hessie Knows, MD       Or  . acetaminophen (TYLENOL) suppository 650 mg  650 mg Rectal Q6H PRN Hessie Knows, MD      . alum & mag hydroxide-simeth (MAALOX/MYLANTA) 200-200-20 MG/5ML suspension 30 mL  30 mL Oral Q4H PRN Hessie Knows, MD      . bisacodyl (DULCOLAX) suppository 10 mg  10 mg Rectal Daily PRN Hessie Knows, MD      . ceFAZolin (ANCEF) IVPB 2g/100 mL premix  2 g Intravenous Q6H Hessie Knows, MD      . diphenhydrAMINE (BENADRYL) 12.5 MG/5ML elixir 12.5-25 mg  12.5-25 mg Oral Q4H PRN Hessie Knows, MD      . docusate sodium (COLACE) capsule 100 mg  100 mg Oral BID Hessie Knows, MD      . Derrill Memo ON 12/17/2016] enoxaparin (LOVENOX) injection 40 mg  40 mg Subcutaneous Q24H Hessie Knows, MD      . magnesium citrate solution 1 Bottle  1 Bottle Oral Once PRN Hessie Knows, MD      . magnesium hydroxide (MILK OF MAGNESIA) suspension 30 mL  30 mL Oral Daily PRN Hessie Knows, MD      . menthol-cetylpyridinium (CEPACOL) lozenge 3 mg  1 lozenge Oral PRN Hessie Knows, MD       Or  . phenol (CHLORASEPTIC) mouth spray 1 spray  1 spray Mouth/Throat PRN Hessie Knows, MD      . methocarbamol (ROBAXIN) tablet 500 mg  500 mg Oral Q6H PRN Hessie Knows, MD       Or  . methocarbamol (ROBAXIN) 500 mg in dextrose 5 % 50 mL IVPB  500 mg Intravenous Q6H PRN Hessie Knows, MD      . metoCLOPramide (REGLAN) tablet 5-10 mg  5-10 mg Oral Q8H PRN Hessie Knows, MD       Or  . metoCLOPramide (REGLAN) injection 5-10 mg  5-10 mg Intravenous Q8H PRN Hessie Knows, MD      . morphine 2 MG/ML injection 2 mg  2 mg Intravenous Q2H PRN Hessie Knows, MD      . ondansetron [facility]) tablet 4 mg  4 mg Oral Q6H PRN Hessie Knows, MD       Or  . ondansetron [facility]) injection 4 mg  4 mg Intravenous Q6H PRN Hessie Knows, MD      . oxyCODONE (Oxy IR/ROXICODONE) immediate release tablet 5-10 mg  5-10 mg Oral Q3H PRN Hessie Knows, MD      . zolpidem (AMBIEN) tablet 5 mg  5 mg Oral QHS PRN Hessie Knows, MD         Discharge Medications: Please see discharge summary for a list of discharge medications.  Relevant Imaging Results:  Relevant Lab Results:   Additional Information  (SSN: [id number])  Ashaki Frosch, Veronia Beets, LCSW

## 2016-12-16 NOTE — Anesthesia Post-op Follow-up Note (Cosign Needed)
Anesthesia QCDR form completed.        

## 2016-12-16 NOTE — Op Note (Signed)
12/16/2016  1:12 PM  PATIENT:  Cindy Yang  61 y.o. female  PRE-OPERATIVE DIAGNOSIS:  PRIMARY OSTEOARTHRITIS LEFT HIP  POST-OPERATIVE DIAGNOSIS:  PRIMARY OSTEOARTHRITIS LEFT HIP  PROCEDURE:  Procedure(s): TOTAL HIP ARTHROPLASTY ANTERIOR APPROACH (Left)  SURGEON: Laurene Footman, MD  ASSISTANTS: none  ANESTHESIA:   spinal  EBL:  Total I/O In: 800 [I.V.:800] Out: 400 [Urine:100; Blood:300]  BLOOD ADMINISTERED:none  DRAINS: (2) Hemovact drain(s) in the Subcutaneous layer with  Suction Open   LOCAL MEDICATIONS USED:  MARCAINE     SPECIMEN:  Source of Specimen:  Left femoral head  DISPOSITION OF SPECIMEN:  PATHOLOGY  COUNTS:  YES  TOURNIQUET:  * No tourniquets in log *  IMPLANTS: Medacta AMIS 3 standard, 48 mm Mpact DM cup with liner and S 28 mm head metal  DICTATION: .Dragon Dictation   The patient was brought to the operating room and after spinal anesthesia was obtained patient was placed on the operative table with the ipsilateral foot into the Medacta attachment, contralateral leg on a well-padded table. C-arm was brought in and preop template x-ray taken. After prepping and draping in usual sterile fashion appropriate patient identification and timeout procedures were completed. Anterior approach to the hip was obtained and centered over the greater trochanter and TFL muscle. The subcutaneous tissue was incised hemostasis being achieved by electrocautery. TFL fascia was incised and the muscle retracted laterally deep retractor placed. The lateral femoral circumflex vessels were identified and ligated. The anterior capsule was exposed and a capsulotomy performed. The neck was identified and a femoral neck cut carried out with a saw. The head was removed without difficulty and showed sclerotic femoral head and acetabulum. Reaming was carried out to 48 mm and a 48 mm cup trial gave appropriate tightness to the acetabular component a 48 DM cup was impacted into position. The leg  was then externally rotated and ischiofemoral and pubofemoral releases carried out. The femur was sequentially broached to a size 3, size 3S trials were placed and the final components chosen. The 3 standard stem was inserted along with a S 28 mm head and 48 mm liner. The hip was reduced and was stable the wound was thoroughly irrigated.  FiberWire was placed on the posterior capsular aid in postop hemostasis The deep fascia was closed using a heavy Quill after infiltration of 30 cc of quarter percent Sensorcaine with epinephrine. Subcutaneous drains were then inserted with 3-0 v-loc subcutaneous closure followed by skin staples and incisional wound VAC  PLAN OF CARE: Admit to inpatient

## 2016-12-17 LAB — BASIC METABOLIC PANEL
ANION GAP: 7 (ref 5–15)
BUN: 13 mg/dL (ref 6–20)
CALCIUM: 8.5 mg/dL — AB (ref 8.9–10.3)
CO2: 25 mmol/L (ref 22–32)
Chloride: 107 mmol/L (ref 101–111)
Creatinine, Ser: 0.7 mg/dL (ref 0.44–1.00)
GFR calc non Af Amer: >60 mL/min (ref >60)
Glucose, Bld: 108 mg/dL — ABNORMAL HIGH (ref 65–99)
POTASSIUM: 3.6 mmol/L (ref 3.5–5.1)
Sodium: 139 mmol/L (ref 135–145)

## 2016-12-17 LAB — CBC
HEMATOCRIT: 37 % (ref 35.0–47.0)
HEMOGLOBIN: 12.7 g/dL (ref 12.0–16.0)
MCH: 28.4 pg (ref 26.0–34.0)
MCHC: 34.4 g/dL (ref 32.0–36.0)
MCV: 82.7 fL (ref 80.0–100.0)
Platelets: 147 10*3/uL — ABNORMAL LOW (ref 150–440)
RBC: 4.47 MIL/uL (ref 3.80–5.20)
RDW: 15.2 % — ABNORMAL HIGH (ref 11.5–14.5)
WBC: 8.4 10*3/uL (ref 3.6–11.0)

## 2016-12-17 NOTE — Progress Notes (Signed)
   Subjective: 1 Day Post-Op Procedure(s) (LRB): TOTAL HIP ARTHROPLASTY ANTERIOR APPROACH (Left) Patient reports pain as moderate.   Patient is well, and has had no acute complaints or problems Patient did well with therapy yesterday. She was able to sit at the edge of bed and then walked to the recliner. Plan is to go Home after hospital stay. no nausea and no vomiting Patient denies any chest pains or shortness of breath. Objective: Vital signs in last 24 hours: Temp:  [97.3 F (36.3 C)-100.4 F (38 C)] 100.4 F (38 C) (07/04 0258) Pulse Rate:  [56-73] 62 (07/04 0258) Resp:  [11-18] 18 (07/04 0258) BP: (91-144)/(50-68) 109/54 (07/04 0258) SpO2:  [95 %-100 %] 95 % (07/04 0258) Weight:  [77.1 kg (170 lb)] 77.1 kg (170 lb) (07/03 1043) Wound VAC in place Heels are non tender and elevated off the bed using rolled towels Intake/Output from previous day: 07/03 0701 - 07/04 0700 In: 2198.8 [I.V.:1898.8; IV Piggyback:300] Out: 850 [Urine:550; Blood:300] Intake/Output this shift: Total I/O In: 1298.8 [I.V.:998.8; IV Piggyback:300] Out: 400 [Urine:400]   Recent Labs  12/16/16 1055 12/16/16 1522 12/17/16 0330  HGB 16.0* 14.2 12.7    Recent Labs  12/16/16 1522 12/17/16 0330  WBC 8.6 8.4  RBC 5.13 4.47  HCT 43.0 37.0  PLT 169 147*    Recent Labs  12/16/16 1055 12/16/16 1522 12/17/16 0330  NA 142  --  139  K 4.0  --  3.6  CL  --   --  107  CO2  --   --  25  BUN  --   --  13  CREATININE  --  0.76 0.70  GLUCOSE 94  --  108*  CALCIUM  --   --  8.5*   No results for input(s): LABPT, INR in the last 72 hours.  EXAM General - Patient is Alert, Appropriate and Oriented Extremity - Neurologically intact Neurovascular intact Sensation intact distally Intact pulses distally No cellulitis present Compartment soft Dressing - Wound VAC in place Motor Function - intact, moving foot and toes well on exam.    Past Medical History:  Diagnosis Date  . Dyspnea   .  History of prediabetes 10/18/2015  . Shoulder tendonitis, left 10/07/2016    Assessment/Plan: 1 Day Post-Op Procedure(s) (LRB): TOTAL HIP ARTHROPLASTY ANTERIOR APPROACH (Left) Active Problems:   Primary localized osteoarthritis of left hip  Estimated body mass index is 26.23 kg/m as calculated from the following:   Height as of this encounter: 5' 7.5" (1.715 m).   Weight as of this encounter: 77.1 kg (170 lb). Advance diet Up with therapy D/C IV fluids Plan for discharge tomorrow Discharge home with home health  Labs: Were reviewed and acceptable DVT Prophylaxis - Lovenox, Foot Pumps and TED hose Weight-Bearing as tolerated to left leg D/C O2 and Pulse OX and try on Room Air Begin working on bowel movement  Jon R. [place] 12/17/2016, 6:53 AM

## 2016-12-17 NOTE — Progress Notes (Signed)
Clinical Social Worker (CSW) received SNF consult. PT is recommending home health. Please reconsult if future social work needs arise. CSW signing off.   Tashan Kreitzer, LCSW [phone] 

## 2016-12-17 NOTE — Evaluation (Addendum)
Physical Therapy Evaluation Patient Details Name: Cindy Yang MRN: [id number] DOB: October 06, 1955 Today's Date: 12/17/2016   History of Present Illness  admitted status post L THR, anterior approach, 12/16/16.  Clinical Impression  Patient generally uncomfortable in bed upon arrival to room; eager for OOB, repositioning and mobility attempts.  Demonstrates excellent effort and participation with all activities, completing supine/sit, sit/stand, basic transfers and short-distance gait (5') with RW, cga/min assist.  Fair L LE stance time/weight acceptance (remains guarded, decreased active use with transitional movements); good overall stability and control of movement  Anticipate consistent progression towards all mobility goals as pain controlled during recovery. Would benefit from skilled PT to address above deficits and promote optimal return to PLOF; Recommend transition to [facility] upon discharge from acute hospitalization.  Will continue to monitor progress and update as appropriate.     Follow Up Recommendations Home health PT    Equipment Recommendations  Rolling walker with 5" wheels;3in1 (PT)    Recommendations for Other Services       Precautions / Restrictions Precautions Precautions: Anterior Hip;Fall Restrictions Weight Bearing Restrictions: Yes LLE Weight Bearing: Weight bearing as tolerated      Mobility  Bed Mobility Overal bed mobility: Needs Assistance Bed Mobility: Supine to Sit     Supine to sit: Min guard     General bed mobility comments: very light assist for L LE management towards edge of bed  Transfers Overall transfer level: Needs assistance Equipment used: Rolling walker (2 wheeled) Transfers: Sit to/from Stand Sit to Stand: Min guard         General transfer comment: cuing for hand placement and L LE placement (to minimize pain with movement transition)  Ambulation/Gait Ambulation/Gait assistance: Min guard Ambulation Distance (Feet): 5  Feet Assistive device: Rolling walker (2 wheeled)       General Gait Details: 3-point step to gait pattern with fair WBing L LE; good stability and control of movement.  Stairs            Wheelchair Mobility    Modified Rankin (Stroke Patients Only)       Balance Overall balance assessment: Needs assistance Sitting-balance support: No upper extremity supported;Feet supported Sitting balance-Leahy Scale: Good     Standing balance support: Bilateral upper extremity supported Standing balance-Leahy Scale: Fair                               Pertinent Vitals/Pain Pain Assessment: Faces Faces Pain Scale: Hurts whole lot Pain Location: lower abdomen, L hip Pain Descriptors / Indicators: Aching;Sore Pain Intervention(s): Limited activity within patient's tolerance;Monitored during session;Repositioned    Home Living Family/patient expects to be discharged to:: Private residence Living Arrangements: Spouse/significant other Available Help at Discharge: Family;Available 24 hours/day Type of Home: House Home Access: Stairs to enter Entrance Stairs-Rails: Can reach both Entrance Stairs-Number of Steps: 2 Home Layout: One level        Prior Function Level of Independence: Independent         Comments: Indep with ADLs, household and community mobility; denies fall history.     Hand Dominance        Extremity/Trunk Assessment   Upper Extremity Assessment Upper Extremity Assessment: Overall WFL for tasks assessed    Lower Extremity Assessment Lower Extremity Assessment:  (L hip grossly 3-/5, limited by pain; otherwise, bilat LEs grossly WFL)       Communication   Communication: No difficulties  Cognition Arousal/Alertness: Awake/alert  Behavior During Therapy: WFL for tasks assessed/performed Overall Cognitive Status: Within Functional Limits for tasks assessed                                        General Comments       Exercises     Assessment/Plan    PT Assessment Patient needs continued PT services  PT Problem List Decreased strength;Decreased range of motion;Decreased activity tolerance;Decreased balance;Decreased mobility;Decreased safety awareness;Decreased knowledge of precautions;Pain       PT Treatment Interventions Stair training;DME instruction;Gait training;Functional mobility training;Therapeutic activities;Therapeutic exercise;Balance training;Patient/family education    PT Goals (Current goals can be found in the Care Plan section)  Acute Rehab PT Goals Patient Stated Goal: to change positions and see if I can get more comfortable PT Goal Formulation: With patient Time For Goal Achievement: 12/31/16 Potential to Achieve Goals: Good    Frequency BID   Barriers to discharge        Co-evaluation               AM-PAC PT "6 Clicks" Daily Activity  Outcome Measure Difficulty turning over in bed (including adjusting bedclothes, sheets and blankets)?: Total Difficulty moving from lying on back to sitting on the side of the bed? : Total Difficulty sitting down on and standing up from a chair with arms (e.g., wheelchair, bedside commode, etc,.)?: Total Help needed moving to and from a bed to chair (including a wheelchair)?: A Little Help needed walking in hospital room?: A Little Help needed climbing 3-5 steps with a railing? : A Lot 6 Click Score: 11    End of Session Equipment Utilized During Treatment: Gait belt Activity Tolerance: Patient tolerated treatment well Patient left: in chair;with call bell/phone within reach;with chair alarm set;with family/visitor present Nurse Communication: Mobility status PT Visit Diagnosis: Muscle weakness (generalized) (M62.81);Difficulty in walking, not elsewhere classified (R26.2);Pain Pain - Right/Left: Left Pain - part of body: Hip    Time: 1715-1735 PT Time Calculation (min) (ACUTE ONLY): 20 min   Charges:   PT Evaluation $PT  Eval Low Complexity: 1 Procedure     PT G Codes:        Mariany Mackintosh H. Owens Shark, PT, DPT, NCS 12/17/16, 12:12 AM [phone]

## 2016-12-17 NOTE — Progress Notes (Signed)
Ch rounding unit visit pt. Pt, pt's family and care providers were in the Rm at the time of this visit. Ch talked to family members and promised to make a follow up visit with pt again later, which he did, but again found nurses in the Rm. [place] to make a follow up visit with pt as needed.   12/17/16 1500  Clinical Encounter Type  Visited With Patient and family together  Visit Type Initial;Follow-up;Spiritual support  Referral From [place] Needs Prayer;Emotional

## 2016-12-17 NOTE — Anesthesia Postprocedure Evaluation (Signed)
Anesthesia Post Note  Patient: Cindy Yang  Procedure(s) Performed: Procedure(s) (LRB): TOTAL HIP ARTHROPLASTY ANTERIOR APPROACH (Left)  Patient location during evaluation: Nursing Unit Anesthesia Type: Spinal Level of consciousness: awake, awake and alert and oriented Pain management: pain level controlled Respiratory status: spontaneous breathing and nonlabored ventilation Cardiovascular status: blood pressure returned to baseline and stable Postop Assessment: no headache, no backache, adequate PO intake, no signs of nausea or vomiting and patient able to bend at knees Anesthetic complications: no     Last Vitals:  Vitals:   12/17/16 0258 12/17/16 0753  BP: (!) 109/54 (!) 110/54  Pulse: 62 61  Resp: 18 16  Temp: (!) 38 C 37.3 C    Last Pain:  Vitals:   12/17/16 0844  TempSrc:   PainSc: 8                  Ionia Schey,  Lubertha Leite R

## 2016-12-17 NOTE — Progress Notes (Signed)
Physical Therapy Treatment Patient Details Name: Cindy Yang MRN: [id number] DOB: [birth year] Today's Date: 12/17/2016    History of Present Illness admitted status post L THR, anterior approach, 12/16/16.    PT Comments    Pt nauseous and received medication from nursing.  Agrees to session.  Participated in exercises as described below.  To edge of bed with min assist.  She was able to stand and ambulate to doorway with walker and min assist at times to advance RLE.  Overall slow movements but no LOB or buckling noted.  Husband in attendance.  Limited by fatigue.     Follow Up Recommendations  Home health PT     Equipment Recommendations  Rolling walker with 5" wheels;3in1 (PT)    Recommendations for Other Services       Precautions / Restrictions Precautions Precautions: Anterior Hip;Fall Restrictions Weight Bearing Restrictions: Yes LLE Weight Bearing: Weight bearing as tolerated    Mobility  Bed Mobility Overal bed mobility: Needs Assistance Bed Mobility: Supine to Sit     Supine to sit: Min assist     General bed mobility comments: assist to get legs to edge of bed.  Transfers Overall transfer level: Needs assistance Equipment used: Rolling walker (2 wheeled) Transfers: Sit to/from Stand Sit to Stand: Min guard         General transfer comment: increased time and verbal cues  Ambulation/Gait Ambulation/Gait assistance: Min guard Ambulation Distance (Feet): 15 Feet Assistive device: Rolling walker (2 wheeled) Gait Pattern/deviations: Step-to pattern   Gait velocity interpretation: <1.8 ft/sec, indicative of risk for recurrent falls General Gait Details: step to pattern with assist at times to advance LLE, no LOB or buckling noted   Stairs            Wheelchair Mobility    Modified Rankin (Stroke Patients Only)       Balance Overall balance assessment: Needs assistance Sitting-balance support: No upper extremity supported;Feet  supported Sitting balance-Leahy Scale: Good     Standing balance support: Bilateral upper extremity supported Standing balance-Leahy Scale: Fair                              Cognition Arousal/Alertness: Awake/alert Behavior During Therapy: WFL for tasks assessed/performed Overall Cognitive Status: Within Functional Limits for tasks assessed                                        Exercises Other Exercises Other Exercises: Supine AA/AROM for ankle pumps, glut sets, heel slides, AB/ADD SAQ, pillow squeezes, x 10.  Difficulty with quad sets.  Seated LAQ x 10    General Comments        Pertinent Vitals/Pain Pain Assessment: 0-10 Pain Score: 5  Pain Location: comfortable at rest with medications but increases with mobility Pain Descriptors / Indicators: Aching;Sore Pain Intervention(s): Limited activity within patient's tolerance;Monitored during session    Home Living                      Prior Function            PT Goals (current goals can now be found in the care plan section) Progress towards PT goals: Progressing toward goals    Frequency    BID      PT Plan Current plan remains appropriate    Co-evaluation  AM-PAC PT "6 Clicks" Daily Activity  Outcome Measure  Difficulty turning over in bed (including adjusting bedclothes, sheets and blankets)?: Total Difficulty moving from lying on back to sitting on the side of the bed? : Total Difficulty sitting down on and standing up from a chair with arms (e.g., wheelchair, bedside commode, etc,.)?: Total Help needed moving to and from a bed to chair (including a wheelchair)?: A Little Help needed walking in hospital room?: A Little Help needed climbing 3-5 steps with a railing? : A Lot 6 Click Score: 11    End of Session Equipment Utilized During Treatment: Gait belt Activity Tolerance: Patient tolerated treatment well Patient left: in chair;with call  bell/phone within reach;with chair alarm set   Pain - Right/Left: Left Pain - part of body: Hip     Time: 1552-0802 PT Time Calculation (min) (ACUTE ONLY): 39 min  Charges:  $Gait Training: 8-22 mins $Therapeutic Exercise: 8-22 mins $Therapeutic Activity: 8-22 mins                    G Codes:       Chesley Noon, PTA 12/17/16, 3:11 PM

## 2016-12-17 NOTE — Progress Notes (Signed)
Foley d/c'd at [phone]

## 2016-12-18 NOTE — Progress Notes (Signed)
Physical Therapy Treatment Patient Details Name: MAKAYLYN SINYARD MRN: [id number] DOB: 03/28/1956 Today's Date: 12/18/2016    History of Present Illness admitted status post L THR, anterior approach, 12/16/16.    PT Comments    Pt progressing toward her goals. This session limited due to pt's R hip pain (anterior hip/thigh, rated 8/10 with movement). Ambulated 87ft with RW and chair follow before requiring seated resting break. Pt able to ambulate another 22ft before ceasing session due to pain. Pt better able to advance her LLE and clear her toes this PM with sock turned right-side-out. Pt continues to be motivated to participate in PT. Will continue to progress with stair negotiation tomorrow AM.    Follow Up Recommendations  Home health PT     Equipment Recommendations  Rolling walker with 5" wheels    Recommendations for Other Services       Precautions / Restrictions Precautions Precautions: Anterior Hip;Fall Precaution Booklet Issued: Yes (comment) Precaution Comments: HEP packet reviewed with pt this session Restrictions Weight Bearing Restrictions: Yes LLE Weight Bearing: Weight bearing as tolerated    Mobility  Bed Mobility Overal bed mobility: Needs Assistance Bed Mobility: Supine to Sit     Supine to sit: Min assist     General bed mobility comments: Min assist for LLE management to EOB. Pt able to pull on PT and use handrail with good technique. No dizziness noted once EOB.  Transfers Overall transfer level: Needs assistance Equipment used: Rolling walker (2 wheeled) Transfers: Sit to/from Stand Sit to Stand: Min guard;Supervision         General transfer comment: Mild dizziness once standing, but this passed within 28min. Pt able to stand without verbal cueing for hand placement.  Ambulation/Gait Ambulation/Gait assistance: Min guard Ambulation Distance (Feet): 60 Feet Assistive device: Rolling walker (2 wheeled) Gait Pattern/deviations: Step-to  pattern;Trunk flexed;Antalgic Gait velocity: .42ft/s   General Gait Details: Ambulated 23ft with RW, min guard and chair follow. Pt complaining of very intense pain while ambulating (8/10). Took 2-67min rest break in recliner after ambulating 78ft prior to ambulating another 78ft. Pt then unable to continue ambulating due to pain. Pt able to progress LLE with sock turned right side out.    Stairs            Wheelchair Mobility    Modified Rankin (Stroke Patients Only)       Balance Overall balance assessment: No apparent balance deficits (not formally assessed) (stood with BUE support on RW with no LOB/unsteadiness noted)                                          Cognition Arousal/Alertness: Awake/alert Behavior During Therapy: WFL for tasks assessed/performed Overall Cognitive Status: Within Functional Limits for tasks assessed                                        Exercises Other Exercises Other Exercises: Supine ther-ex x12 on LLE: ankle pumps B, quad set, glute squeeze, and hip abd. Pt only able to perform 5 reps of hip abd with mod assist due to pain. All others performed with supervision.    General Comments        Pertinent Vitals/Pain Pain Assessment: 0-10 Pain Score: 8  Pain Location: L hip Pain Descriptors / Indicators:  Aching;Sore Pain Intervention(s): Limited activity within patient's tolerance;Monitored during session;Premedicated before session;Repositioned    Home Living                      Prior Function            PT Goals (current goals can now be found in the care plan section) Acute Rehab PT Goals Patient Stated Goal: to change positions and see if I can get more comfortable PT Goal Formulation: With patient Time For Goal Achievement: 12/31/16 Potential to Achieve Goals: Good Progress towards PT goals: Progressing toward goals (progressing slowly - limited by pain)    Frequency     BID      PT Plan Current plan remains appropriate    Co-evaluation              AM-PAC PT "6 Clicks" Daily Activity  Outcome Measure  Difficulty turning over in bed (including adjusting bedclothes, sheets and blankets)?: A Lot Difficulty moving from lying on back to sitting on the side of the bed? : Total Difficulty sitting down on and standing up from a chair with arms (e.g., wheelchair, bedside commode, etc,.)?: Total Help needed moving to and from a bed to chair (including a wheelchair)?: A Little Help needed walking in hospital room?: A Little Help needed climbing 3-5 steps with a railing? : A Lot 6 Click Score: 12    End of Session Equipment Utilized During Treatment: Gait belt Activity Tolerance: Patient limited by pain Patient left: in chair;with call bell/phone within reach;with family/visitor present Nurse Communication: Mobility status PT Visit Diagnosis: Muscle weakness (generalized) (M62.81);Difficulty in walking, not elsewhere classified (R26.2);Pain Pain - Right/Left: Left Pain - part of body: Hip     Time: 3295-1884 PT Time Calculation (min) (ACUTE ONLY): 31 min  Charges:                       G Codes:       Donaciano Eva, PT, SPT  Remon Quinto 12/18/2016, 4:04 PM

## 2016-12-18 NOTE — Care Management (Signed)
Attempted assessment and patient had a room full of company. She ask that I come back at another time.

## 2016-12-18 NOTE — Progress Notes (Signed)
Pt alert and oriented. Pt able to sleep in between care. Surgical site dry and intact. Wound vac functioning without issue. No drainage. Medicated for pain during the night with good results. Up to bedside commode and voiding without difficulty.

## 2016-12-18 NOTE — Progress Notes (Signed)
Physical Therapy Treatment Patient Details Name: Cindy Yang MRN: [id number] DOB: October 08, 1955 Today's Date: 12/18/2016    History of Present Illness admitted status post L THR, anterior approach, 12/16/16.    PT Comments    Improved gait distance; able to indep advance L LE this date (with sock turned inside out to decrease friction).  Cuing from therapist for consistent cadence and gait speed (encouraged to minimize start/stop episodes).  Pain and burning in L hip barrier to continued distance. Husband present and very supportive/encouraging to patient.  Will plan to attempt gait around nursing station this PM.    Follow Up Recommendations  Home health PT     Equipment Recommendations  Rolling walker with 5" wheels    Recommendations for Other Services       Precautions / Restrictions Precautions Precautions: Anterior Hip;Fall Restrictions Weight Bearing Restrictions: Yes LLE Weight Bearing: Weight bearing as tolerated    Mobility  Bed Mobility Overal bed mobility: Needs Assistance Bed Mobility: Supine to Sit     Supine to sit: Min assist     General bed mobility comments: assist for L LE management over edge of bed; educated in hook technique for use as needed  Transfers Overall transfer level: Needs assistance Equipment used: Rolling walker (2 wheeled) Transfers: Sit to/from Stand Sit to Stand: Min guard;Supervision         General transfer comment: encouraged for increased use of L LE during movement transition as tolerated  Ambulation/Gait Ambulation/Gait assistance: Min guard Ambulation Distance (Feet):  (30x2) Assistive device: Rolling walker (2 wheeled)       General Gait Details: step to progresing to step through pattern, cuing for increased cadence with continuous stepping pattern.  Able to indep advance L LE this date (sock turned inside out to decrease friction)   Stairs            Wheelchair Mobility    Modified Rankin (Stroke  Patients Only)       Balance Overall balance assessment: Needs assistance Sitting-balance support: No upper extremity supported;Feet supported Sitting balance-Leahy Scale: Good     Standing balance support: Bilateral upper extremity supported Standing balance-Leahy Scale: Fair                              Cognition Arousal/Alertness: Awake/alert Behavior During Therapy: WFL for tasks assessed/performed Overall Cognitive Status: Within Functional Limits for tasks assessed                                        Exercises Other Exercises Other Exercises: Seated LE therex, 1x15, AROM for muscular strength/endurance.  Decreased ability to activate L hip flexion due to pain/burning. Other Exercises: Sit/stand with RW, cga/close sup-cuing for hand placement; step by step cuing for sequence/control with stand to sit    General Comments        Pertinent Vitals/Pain Pain Assessment: 0-10 Pain Score: 7  Pain Location: comfortable at rest with medications but increases with mobility Pain Descriptors / Indicators: Aching;Sore Pain Intervention(s): Limited activity within patient's tolerance;Monitored during session;Premedicated before session;Repositioned    Home Living                      Prior Function            PT Goals (current goals can now be found in the  care plan section) Acute Rehab PT Goals Patient Stated Goal: to change positions and see if I can get more comfortable PT Goal Formulation: With patient Time For Goal Achievement: 12/31/16 Potential to Achieve Goals: Good    Frequency    BID      PT Plan      Co-evaluation              AM-PAC PT "6 Clicks" Daily Activity  Outcome Measure  Difficulty turning over in bed (including adjusting bedclothes, sheets and blankets)?: Total Difficulty moving from lying on back to sitting on the side of the bed? : Total Difficulty sitting down on and standing up from a  chair with arms (e.g., wheelchair, bedside commode, etc,.)?: Total Help needed moving to and from a bed to chair (including a wheelchair)?: A Little Help needed walking in hospital room?: A Little Help needed climbing 3-5 steps with a railing? : A Lot 6 Click Score: 11    End of Session Equipment Utilized During Treatment: Gait belt Activity Tolerance: Patient tolerated treatment well Patient left: in chair;with call bell/phone within reach;with family/visitor present (CNA to connect alarm when bath compelte) Nurse Communication: Mobility status PT Visit Diagnosis: Muscle weakness (generalized) (M62.81);Difficulty in walking, not elsewhere classified (R26.2);Pain Pain - Right/Left: Left Pain - part of body: Hip     Time: 0931-1001 PT Time Calculation (min) (ACUTE ONLY): 30 min  Charges:  $Gait Training: 8-22 mins $Therapeutic Exercise: 8-22 mins                    G Codes:       Liston Thum H. Owens Shark, PT, DPT, NCS 12/18/16, 10:49 AM [phone]

## 2016-12-18 NOTE — Progress Notes (Signed)
Patient a&o, vss. Pain meds given with relief. Dressing dry and intact on left hip, prevena wound vac in place. Family at bedside. No complaints at this time. Continue to monitor.

## 2016-12-18 NOTE — Care Management Note (Signed)
Case Management Note  Patient Details  Name: Cindy Yang MRN: [id number] Date of Birth: 1955-12-27  Subjective/Objective:  POD # 2 left THA. Met with patient at bedside to discuss discharge planning. Offered choice of home care agencies. [facility] [place]. She will need a walker and BSC. Ordered from [facility]. Pharmacy: [facility] [phone]. Called Lovenox 40 mg # 14 no refills. Spouse at bedside. He will be patients care giver at discharge. PCP is [facility]                   Action/Plan: [facility] for HHPT, [facility] for walker and bsc. Lovenox called it. It is anticipated patient will discharge tomorrow.   Expected Discharge Date:  12/18/16               Expected Discharge Plan:  [place]  In-House Referral:     Discharge planning Services  CM Consult  Post Acute Care Choice:  Durable Medical Equipment, Home Health Choice offered to:  Patient  DME Arranged:  Walker rolling, Bedside commode DME Agency:  [place]:  PT [place]:  [facility]  Status of Service:  In process, will continue to follow  If discussed at Long Length of Stay Meetings, dates discussed:    Additional Comments:  Jolly Mango, RN 12/18/2016, 4:11 PM

## 2016-12-18 NOTE — Progress Notes (Signed)
   Subjective: 2 Days Post-Op Procedure(s) (LRB): TOTAL HIP ARTHROPLASTY ANTERIOR APPROACH (Left) Patient reports pain as mild.   Patient is well, and has had no acute complaints or problems Denies any CP, SOB, ABD pain. We will continue therapy today.    Objective: Vital signs in last 24 hours: Temp:  [98.1 F (36.7 C)-100.3 F (37.9 C)] 99.6 F (37.6 C) (07/05 0722) Pulse Rate:  [61-77] 73 (07/05 0722) Resp:  [16-18] 18 (07/05 0722) BP: (107-115)/(52-63) 107/52 (07/05 0722) SpO2:  [92 %-96 %] 92 % (07/05 0722)  Intake/Output from previous day: 07/04 0701 - 07/05 0700 In: 1813.8 [P.O.:1000; I.V.:813.8] Out: 420 [Urine:400; Drains:20] Intake/Output this shift: No intake/output data recorded.   Recent Labs  12/16/16 1055 12/16/16 1522 12/17/16 0330  HGB 16.0* 14.2 12.7    Recent Labs  12/16/16 1522 12/17/16 0330  WBC 8.6 8.4  RBC 5.13 4.47  HCT 43.0 37.0  PLT 169 147*    Recent Labs  12/16/16 1055 12/16/16 1522 12/17/16 0330  NA 142  --  139  K 4.0  --  3.6  CL  --   --  107  CO2  --   --  25  BUN  --   --  13  CREATININE  --  0.76 0.70  GLUCOSE 94  --  108*  CALCIUM  --   --  8.5*   No results for input(s): LABPT, INR in the last 72 hours.  EXAM General - Patient is Alert, Appropriate and Oriented Extremity - Neurovascular intact Sensation intact distally Intact pulses distally Dorsiflexion/Plantar flexion intact No cellulitis present Compartment soft Dressing - dressing C/D/I, no drainage and wound vac intact Motor Function - intact, moving foot and toes well on exam.   Past Medical History:  Diagnosis Date  . Dyspnea   . History of prediabetes 10/18/2015  . Shoulder tendonitis, left 10/07/2016    Assessment/Plan:   2 Days Post-Op Procedure(s) (LRB): TOTAL HIP ARTHROPLASTY ANTERIOR APPROACH (Left) Active Problems:   Primary localized osteoarthritis of left hip  Estimated body mass index is 26.23 kg/m as calculated from the  following:   Height as of this encounter: 5' 7.5" (1.715 m).   Weight as of this encounter: 77.1 kg (170 lb). Advance diet Up with therapy  Needs BM Plan on discharge to home tomorrow with HHPT  DVT Prophylaxis - Lovenox, Foot Pumps and TED hose Weight-Bearing as tolerated to left leg   T. Rachelle Hora, PA-C [place] 12/18/2016, 7:33 AM

## 2016-12-18 NOTE — Progress Notes (Signed)
Pt agrees to suppository in the morning.

## 2016-12-19 ENCOUNTER — Encounter: Payer: Self-pay | Admitting: Orthopedic Surgery

## 2016-12-19 LAB — SURGICAL PATHOLOGY

## 2016-12-19 MED ORDER — OXYCODONE HCL 5 MG PO TABS: 5.0000 mg | ORAL_TABLET | ORAL | 0 refills | Status: DC | PRN

## 2016-12-19 MED ORDER — ENOXAPARIN SODIUM 40 MG/0.4ML ~~LOC~~ SOLN: 40.0000 mg | SUBCUTANEOUS | 0 refills | Status: DC

## 2016-12-19 NOTE — Progress Notes (Signed)
Pt remaining alert and oriented. Medicated for pain with good results. No drainage to wound vac this shift. Up to bedside commode with assistance voiding without difficulty. Pt was able to sleep in between care.

## 2016-12-19 NOTE — Care Management Note (Signed)
Case Management Note  Patient Details  Name: Cindy Yang MRN: [id number] Date of Birth: [birth year]  Subjective/Objective:  Discharging today                  Action/Plan: [facility] notified of discharge. [facility] to deliver DME. Lovenox $ 12.00. Patient update and denies issues paying for medication.   Expected Discharge Date:  12/19/16               Expected Discharge Plan:  [place]  In-House Referral:     Discharge planning Services  CM Consult  Post Acute Care Choice:  Durable Medical Equipment, Home Health Choice offered to:  Patient  DME Arranged:  Walker rolling, Bedside commode DME Agency:  [place]:  PT [place]:  [facility] (formerly [facility])  Status of Service:  Completed, signed off  If discussed at [employer], dates discussed:    Additional Comments:  Jolly Mango, RN 12/19/2016, 8:40 AM

## 2016-12-19 NOTE — Progress Notes (Signed)
Patient ready for discharge, PIV removed, discharge papers given and reviewed. All questions answered. Pt in no acute distress. All belongings sent with patient on discharge.

## 2016-12-19 NOTE — Discharge Instructions (Signed)

## 2016-12-19 NOTE — Progress Notes (Signed)
   Subjective: 3 Days Post-Op Procedure(s) (LRB): TOTAL HIP ARTHROPLASTY ANTERIOR APPROACH (Left) Patient reports pain as mild.   Patient is well, and has had no acute complaints or problems Denies any CP, SOB, ABD pain. We will continue therapy today.    Objective: Vital signs in last 24 hours: Temp:  [99.7 F (37.6 C)] 99.7 F (37.6 C) (07/05 2242) Pulse Rate:  [77] 77 (07/05 2242) Resp:  [18] 18 (07/05 2242) BP: (100)/(53) 100/53 (07/05 2242) SpO2:  [97 %] 97 % (07/05 2242)  Intake/Output from previous day: No intake/output data recorded. Intake/Output this shift: No intake/output data recorded.   Recent Labs  12/16/16 1055 12/16/16 1522 12/17/16 0330  HGB 16.0* 14.2 12.7    Recent Labs  12/16/16 1522 12/17/16 0330  WBC 8.6 8.4  RBC 5.13 4.47  HCT 43.0 37.0  PLT 169 147*    Recent Labs  12/16/16 1055 12/16/16 1522 12/17/16 0330  NA 142  --  139  K 4.0  --  3.6  CL  --   --  107  CO2  --   --  25  BUN  --   --  13  CREATININE  --  0.76 0.70  GLUCOSE 94  --  108*  CALCIUM  --   --  8.5*   No results for input(s): LABPT, INR in the last 72 hours.  EXAM General - Patient is Alert, Appropriate and Oriented Extremity - Neurovascular intact Sensation intact distally Intact pulses distally Dorsiflexion/Plantar flexion intact No cellulitis present Compartment soft Dressing - dressing C/D/I, no drainage and wound vac intact Motor Function - intact, moving foot and toes well on exam.   Past Medical History:  Diagnosis Date  . Dyspnea   . History of prediabetes 10/18/2015  . Shoulder tendonitis, left 10/07/2016    Assessment/Plan:   3 Days Post-Op Procedure(s) (LRB): TOTAL HIP ARTHROPLASTY ANTERIOR APPROACH (Left) Active Problems:   Primary localized osteoarthritis of left hip  Estimated body mass index is 26.23 kg/m as calculated from the following:   Height as of this encounter: 5' 7.5" (1.715 m).   Weight as of this encounter: 77.1 kg  (170 lb). Advance diet Up with therapy  Discharge home with home health PT today pending bowel movement Will need to convert wound VAC to provena prior to discharge Remove wound VAC on 12/26/2016 and apply honeycomb dressing. Follow-up with current total orthopedics in 2 weeks for staple removal   DVT Prophylaxis - Lovenox, Foot Pumps and TED hose Weight-Bearing as tolerated to left leg   T. Rachelle Hora, PA-C [place] 12/19/2016, 7:40 AM

## 2016-12-19 NOTE — Progress Notes (Signed)
Physical Therapy Treatment Patient Details Name: Cindy Yang MRN: [id number] DOB: Nov 19, 1955 Today's Date: 12/19/2016    History of Present Illness admitted status post L THR, anterior approach, 12/16/16.    PT Comments    Pt progressing toward goals. Negotiated 2 steps this session with min assist, L handrail, and verbal cueing for sequencing. Pt then ambulated 24f with RW and min guard. This session limited by pain, however pt was able to meet all her PT goals and is appropriate for dc at this time. RN notified. Will continue to progress if pt does not dc from hospital today.    Follow Up Recommendations  Home health PT     Equipment Recommendations  Rolling walker with 5" wheels    Recommendations for Other Services       Precautions / Restrictions Precautions Precautions: Anterior Hip;Fall Precaution Booklet Issued: Yes (comment) Precaution Comments: HEP packet given last session Restrictions Weight Bearing Restrictions: Yes LLE Weight Bearing: Weight bearing as tolerated    Mobility  Bed Mobility Overal bed mobility: Needs Assistance Bed Mobility: Supine to Sit     Supine to sit: Min assist     General bed mobility comments: Min assist for LLE management to EOB. Pt able to pull on PT and use handrail with good technique. No dizziness noted once EOB.  Transfers Overall transfer level: Needs assistance Equipment used: Rolling walker (2 wheeled) Transfers: Sit to/from Stand Sit to Stand: Min guard;Supervision         General transfer comment: No dizziness noted once standing. No verbal cueing needed. Pt steady on feet.  Ambulation/Gait Ambulation/Gait assistance: Min guard Ambulation Distance (Feet): 60 Feet Assistive device: Rolling walker (2 wheeled) Gait Pattern/deviations: Step-to pattern;Trunk flexed;Antalgic     General Gait Details: Ambulated 644fwith RW, min guard and chair follow. Pt continues to have intense pain while walking and was unable  to continue beyond 6041fhis session. Pt better able to progress LLE and take longer step with RLE (clear R toes, but not heel, beyond L toes).   Stairs Stairs: Yes   Stair Management: One rail Left;Step to pattern Number of Stairs: 2 General stair comments: Verbal cueing for sequencing, hand placement, and encouragement. Pt able to negotiate steps then immediately ambulate 16f20fth RW and min guard.   Wheelchair Mobility    Modified Rankin (Stroke Patients Only)       Balance Overall balance assessment: No apparent balance deficits (not formally assessed)                                          Cognition Arousal/Alertness: Awake/alert Behavior During Therapy: WFL for tasks assessed/performed Overall Cognitive Status: Within Functional Limits for tasks assessed                                        Exercises Other Exercises Other Exercises: Supine ther-ex x12 included ankle pumps B and quad sets on LLE only. Performed with supervision. Performed sit to/from stand 2x this session (see above for more information).    General Comments        Pertinent Vitals/Pain Pain Assessment: Faces Faces Pain Scale: Hurts whole lot Pain Location: L hip Pain Descriptors / Indicators: Aching;Operative site guarding;Grimacing;Guarding Pain Intervention(s): Limited activity within patient's tolerance;Monitored during session;Premedicated before session;Repositioned  Home Living                      Prior Function            PT Goals (current goals can now be found in the care plan section) Acute Rehab PT Goals Patient Stated Goal: to go home PT Goal Formulation: With patient Time For Goal Achievement: 12/31/16 Potential to Achieve Goals: Good Progress towards PT goals: Progressing toward goals    Frequency    BID      PT Plan Current plan remains appropriate    Co-evaluation              AM-PAC PT "6 Clicks" Daily  Activity  Outcome Measure  Difficulty turning over in bed (including adjusting bedclothes, sheets and blankets)?: A Lot Difficulty moving from lying on back to sitting on the side of the bed? : Total Difficulty sitting down on and standing up from a chair with arms (e.g., wheelchair, bedside commode, etc,.)?: Total Help needed moving to and from a bed to chair (including a wheelchair)?: A Little Help needed walking in hospital room?: A Little Help needed climbing 3-5 steps with a railing? : A Little 6 Click Score: 13    End of Session Equipment Utilized During Treatment: Gait belt Activity Tolerance: Patient limited by pain Patient left: in chair;with call bell/phone within reach;with family/visitor present Nurse Communication: Mobility status (Pt met all PT goals) PT Visit Diagnosis: Muscle weakness (generalized) (M62.81);Difficulty in walking, not elsewhere classified (R26.2);Pain Pain - Right/Left: Left Pain - part of body: Hip     Time: 2542-7062 PT Time Calculation (min) (ACUTE ONLY): 30 min  Charges:                       G Codes:       Donaciano Eva, PT, SPT  Marni Griffon 12/19/2016, 10:27 AM

## 2016-12-19 NOTE — Discharge Summary (Signed)
Physician Discharge Summary  Patient ID: Cindy Yang MRN: [id number] DOB/AGE: [birth year] 61 y.o.  Admit date: 12/16/2016 Discharge date: 12/19/2016  Admission Diagnoses:  PRIMARY OSTEOARTHRITIS LEFT HIP   Discharge Diagnoses: Patient Active Problem List   Diagnosis Date Noted  . Primary localized osteoarthritis of left hip 12/16/2016  . Shoulder tendonitis, left 10/07/2016    Past Medical History:  Diagnosis Date  . Dyspnea   . History of prediabetes 10/18/2015  . Shoulder tendonitis, left 10/07/2016     Transfusion: none   Consultants (if any):   Discharged Condition: Improved  Hospital Course: Cindy Yang is an 61 y.o. female who was admitted 12/16/2016 with a diagnosis of left hip osteoarthritis and went to the operating room on 12/16/2016 and underwent the above named procedures.    Surgeries: Procedure(s): TOTAL HIP ARTHROPLASTY ANTERIOR APPROACH on 12/16/2016 Patient tolerated the surgery well. Taken to PACU where she was stabilized and then transferred to the orthopedic floor.  Started on Lovenox Lovenox q 24 hrs. Foot pumps applied bilaterally at 80 mm. Heels elevated on bed with rolled towels. No evidence of DVT. Negative Homan. Physical therapy started on day #1 for gait training and transfer. OT started day #1 for ADL and assisted devices.  Patient's foley was d/c on day #1. Patient's IV and hemovac was d/c on day #2.  On post op day #3 patient was stable and ready for discharge to home with HHPT.  Implants: Medacta AMIS 3 standard, 48 mm Mpact DM cup with liner and S 28 mm head metal  She was given perioperative antibiotics:  Anti-infectives    Start     Dose/Rate Route Frequency Ordered Stop   12/16/16 1800  ceFAZolin (ANCEF) IVPB 2g/100 mL premix     2 g 200 mL/hr over 30 Minutes Intravenous Every 6 hours 12/16/16 1446 12/17/16 0750   12/15/16 2245  ceFAZolin (ANCEF) IVPB 2g/100 mL premix     2 g 200 mL/hr over 30 Minutes Intravenous  Once 12/15/16  2235 12/16/16 1225    .  She was given sequential compression devices, early ambulation, and Lovenox for DVT prophylaxis.  She benefited maximally from the hospital stay and there were no complications.    Recent vital signs:  Vitals:   12/18/16 0722 12/18/16 2242  BP: (!) 107/52 (!) 100/53  Pulse: 73 77  Resp: 18 18  Temp: 99.6 F (37.6 C) 99.7 F (37.6 C)    Recent laboratory studies:  Lab Results  Component Value Date   HGB 12.7 12/17/2016   HGB 14.2 12/16/2016   HGB 16.0 (H) 12/16/2016   Lab Results  Component Value Date   WBC 8.4 12/17/2016   PLT 147 (L) 12/17/2016   Lab Results  Component Value Date   INR 0.93 12/10/2016   Lab Results  Component Value Date   NA 139 12/17/2016   K 3.6 12/17/2016   CL 107 12/17/2016   CO2 25 12/17/2016   BUN 13 12/17/2016   CREATININE 0.70 12/17/2016   GLUCOSE 108 (H) 12/17/2016    Discharge Medications:   Allergies as of 12/19/2016   No Known Allergies     Medication List    TAKE these medications   enoxaparin 40 MG/0.4ML injection Commonly known as:  LOVENOX Inject 0.4 mLs (40 mg total) into the skin daily.   naproxen sodium 220 MG tablet Commonly known as:  ANAPROX Take 440 mg by mouth daily as needed (pain).   oxyCODONE 5 MG immediate release tablet  Commonly known as:  Oxy IR/ROXICODONE Take 1-2 tablets (5-10 mg total) by mouth every 4 (four) hours as needed for breakthrough pain.            Durable Medical Equipment        Start     Ordered   12/19/16 [phone]  For home use only DME Walker rolling  Once    Question:  Patient needs a walker to treat with the following condition  Answer:  Status post total hip replacement, left   12/19/16 0758   12/19/16 0759  For home use only DME Bedside commode  Once    Question:  Patient needs a bedside commode to treat with the following condition  Answer:  S/P total hip arthroplasty   12/19/16 0758      Diagnostic Studies: Dg Hip Operative Unilat W Or W/o  Pelvis Left  Result Date: 12/16/2016 CLINICAL DATA:  Left hip replacement EXAM: OPERATIVE LEFT HIP WITH PELVIS COMPARISON:  None. FLUOROSCOPY TIME:  Radiation Exposure Index (as provided by the fluoroscopic device): 3.9 mGy If the device does not provide the exposure index: Fluoroscopy Time:  18 seconds Number of Acquired Images:  2 FINDINGS: Initial film demonstrates degenerative change of the left hip joint. Subsequent image shows left hip replacement in satisfactory position. IMPRESSION: Status post left hip replacement Electronically Signed   By: Inez Catalina M.D.   On: 12/16/2016 13:06   Dg Hip Unilat W Or W/o Pelvis 2-3 Views Left  Result Date: 12/16/2016 CLINICAL DATA:  Status post left hip replacement EXAM: DG HIP (WITH OR WITHOUT PELVIS) 2V LEFT COMPARISON:  None. FINDINGS: Left hip prosthesis is noted in satisfactory position. Surgical drains are noted in place. No acute bony or soft tissue abnormality is noted. IMPRESSION: Status post left hip prosthesis.  No acute abnormality noted. Electronically Signed   By: Inez Catalina M.D.   On: 12/16/2016 13:59    Disposition: Final discharge disposition not confirmed       Signed: Dorise [place] 12/19/2016, 8:22 AM

## 2016-12-19 NOTE — Progress Notes (Signed)
RN resumed care at 11:30. Patient up and eating in bed at this time. Denies pain. Awaiting BM, drinking mag citrate.   Deri Fuelling, RN

## 2017-06-17 ENCOUNTER — Encounter: Payer: Self-pay | Admitting: Medical

## 2017-06-17 ENCOUNTER — Ambulatory Visit: Payer: Self-pay | Admitting: Medical

## 2017-06-17 VITALS — BP 108/70 | HR 89 | Temp 97.9°F | Resp 18 | Ht 67.0 in | Wt 149.8 lb

## 2017-06-17 DIAGNOSIS — R059 Cough, unspecified: Secondary | ICD-10-CM

## 2017-06-17 DIAGNOSIS — R05 Cough: Secondary | ICD-10-CM

## 2017-06-17 DIAGNOSIS — J40 Bronchitis, not specified as acute or chronic: Secondary | ICD-10-CM

## 2017-06-17 MED ORDER — BENZONATATE 100 MG PO CAPS: 100.0000 mg | ORAL_CAPSULE | Freq: Three times a day (TID) | ORAL | 0 refills | Status: DC | PRN

## 2017-06-17 MED ORDER — PREDNISONE 10 MG (21) PO TBPK: ORAL_TABLET | ORAL | 0 refills | Status: DC

## 2017-06-17 MED ORDER — IPRATROPIUM-ALBUTEROL 0.5-2.5 (3) MG/3ML IN SOLN: 3.0000 mL | Freq: Once | RESPIRATORY_TRACT | Status: DC

## 2017-06-17 MED ORDER — ALBUTEROL SULFATE HFA 108 (90 BASE) MCG/ACT IN AERS: 2.0000 | INHALATION_SPRAY | Freq: Four times a day (QID) | RESPIRATORY_TRACT | 0 refills | Status: DC | PRN

## 2017-06-17 MED ORDER — AMOXICILLIN-POT CLAVULANATE 875-125 MG PO TABS: 1.0000 | ORAL_TABLET | Freq: Two times a day (BID) | ORAL | 0 refills | Status: DC

## 2017-06-17 NOTE — Patient Instructions (Signed)
Smoking Tobacco Information Smoking tobacco will very likely harm your health. Tobacco contains a poisonous (toxic), colorless chemical called nicotine. Nicotine affects the brain and makes tobacco addictive. This change in your brain can make it hard to stop smoking. Tobacco also has other toxic chemicals that can hurt your body and raise your risk of many cancers. How can smoking tobacco affect me? Smoking tobacco can increase your chances of having serious health conditions, such as:  Cancer. Smoking is most commonly associated with lung cancer, but can lead to cancer in other parts of the body.  Chronic obstructive pulmonary disease (COPD). This is a long-term lung condition that makes it hard to breathe. It also gets worse over time.  High blood pressure (hypertension), heart disease, stroke, or heart attack.  Lung infections, such as pneumonia.  Cataracts. This is when the lenses in the eyes become clouded.  Digestive problems. This may include peptic ulcers, heartburn, and gastroesophageal reflux disease (GERD).  Oral health problems, such as gum disease and tooth loss.  Loss of taste and smell.  Smoking can affect your appearance by causing:  Wrinkles.  Yellow or stained teeth, fingers, and fingernails.  Smoking tobacco can also affect your social life.  Many workplaces, restaurants, hotels, and public places are tobacco-free. This means that you may experience challenges in finding places to smoke when away from home.  The cost of a smoking habit can be expensive. Expenses for someone who smokes come in two ways: ? You spend money on a regular basis to buy tobacco. ? Your health care costs in the long-term are higher if you smoke.  Tobacco smoke can also affect the health of those around you. Children of smokers have greater chances of: ? Sudden infant death syndrome (SIDS). ? Ear infections. ? Lung infections.  What lifestyle changes can be made?  Do not start  smoking. Quit if you already do.  To quit smoking: ? Make a plan to quit smoking and commit yourself to it. Look for programs to help you and ask your health care provider for recommendations and ideas. ? Talk with your health care provider about using nicotine replacement medicines to help you quit. Medicine replacement medicines include gum, lozenges, patches, sprays, or pills. ? Do not replace cigarette smoking with electronic cigarettes, which are commonly called e-cigarettes. The safety of e-cigarettes is not known, and some may contain harmful chemicals. ? Avoid places, people, or situations that tempt you to smoke. ? If you try to quit but return to smoking, don't give up hope. It is very common for people to try a number of times before they fully succeed. When you feel ready again, give it another try.  Quitting smoking might affect the way you eat as well as your weight. Be prepared to monitor your eating habits. Get support in planning and following a healthy diet.  Ask your health care provider about having regular tests (screenings) to check for cancer. This may include blood tests, imaging tests, and other tests.  Exercise regularly. Consider taking walks, joining a gym, or doing yoga or exercise classes.  Develop skills to manage your stress. These skills include meditation. What are the benefits of quitting smoking? By quitting smoking, you may:  Lower your risk of getting cancer and other diseases caused by smoking.  Live longer.  Breathe better.  Lower your blood pressure and heart rate.  Stop your addiction to tobacco.  Stop creating secondhand smoke that hurts other people.  Improve your   sense of taste and smell.  Look better over time, due to having fewer wrinkles and less staining.  What can happen if changes are not made? If you do not stop smoking, you may:  Get cancer and other diseases.  Develop COPD or other long-term (chronic) lung  conditions.  Develop serious problems with your heart and blood vessels (cardiovascular system).  Need more tests to screen for problems caused by smoking.  Have higher, long-term healthcare costs from medicines or treatments related to smoking.  Continue to have worsening changes in your lungs, mouth, and nose.  Where to find support: To get support to quit smoking, consider:  Asking your health care provider for more information and resources.  Taking classes to learn more about quitting smoking.  Looking for local organizations that offer resources about quitting smoking.  Joining a support group for people who want to quit smoking in your local community.  Where to find more information: You may find more information about quitting smoking from:  HelpGuide.org: www.helpguide.org/articles/addictions/how-to-quit-smoking.htm  https://hall.com/: smokefree.gov  American Lung Association: www.lung.org  Contact a health care provider if:  You have problems breathing.  Your lips, nose, or fingers turn blue.  You have chest pain.  You are coughing up blood.  You feel faint or you pass out.  You have other noticeable changes that cause you to worry. Summary  Smoking tobacco can negatively affect your health, the health of those around you, your finances, and your social life.  Do not start smoking. Quit if you already do. If you need help quitting, ask your health care provider.  Think about joining a support group for people who want to quit smoking in your local community. There are many effective programs that will help you to quit this behavior. This information is not intended to replace advice given to you by your health care provider. Make sure you discuss any questions you have with your health care provider. Document Released: 06/17/2016 Document Revised: 06/17/2016 Document Reviewed: 06/17/2016 Elsevier Interactive Patient Education  2018 [employer]. Cough,  Adult A cough helps to clear your throat and lungs. A cough may last only 2-3 weeks (acute), or it may last longer than 8 weeks (chronic). Many different things can cause a cough. A cough may be a sign of an illness or another medical condition. Follow these instructions at home:  Pay attention to any changes in your cough.  Take medicines only as told by your doctor. ? If you were prescribed an antibiotic medicine, take it as told by your doctor. Do not stop taking it even if you start to feel better. ? Talk with your doctor before you try using a cough medicine.  Drink enough fluid to keep your pee (urine) clear or pale yellow.  If the air is dry, use a cold steam vaporizer or humidifier in your home.  Stay away from things that make you cough at work or at home.  If your cough is worse at night, try using extra pillows to raise your head up higher while you sleep.  Do not smoke, and try not to be around smoke. If you need help quitting, ask your doctor.  Do not have caffeine.  Do not drink alcohol.  Rest as needed. Contact a doctor if:  You have new problems (symptoms).  You cough up yellow fluid (pus).  Your cough does not get better after 2-3 weeks, or your cough gets worse.  Medicine does not help your cough and  you are not sleeping well.  You have pain that gets worse or pain that is not helped with medicine.  You have a fever.  You are losing weight and you do not know why.  You have night sweats. Get help right away if:  You cough up blood.  You have trouble breathing.  Your heartbeat is very fast. This information is not intended to replace advice given to you by your health care provider. Make sure you discuss any questions you have with your health care provider. Document Released: 02/13/2011 Document Revised: 11/08/2015 Document Reviewed: 08/09/2014 Elsevier Interactive Patient Education  2018 [employer]. Acute Bronchitis, Adult Acute bronchitis is  when air tubes (bronchi) in the lungs suddenly get swollen. The condition can make it hard to breathe. It can also cause these symptoms:  A cough.  Coughing up clear, yellow, or green mucus.  Wheezing.  Chest congestion.  Shortness of breath.  A fever.  Body aches.  Chills.  A sore throat.  Follow these instructions at home: Medicines  Take over-the-counter and prescription medicines only as told by your doctor.  If you were prescribed an antibiotic medicine, take it as told by your doctor. Do not stop taking the antibiotic even if you start to feel better. General instructions  Rest.  Drink enough fluids to keep your pee (urine) clear or pale yellow.  Avoid smoking and secondhand smoke. If you smoke and you need help quitting, ask your doctor. Quitting will help your lungs heal faster.  Use an inhaler, cool mist vaporizer, or humidifier as told by your doctor.  Keep all follow-up visits as told by your doctor. This is important. How is this prevented? To lower your risk of getting this condition again:  Wash your hands often with soap and water. If you cannot use soap and water, use hand sanitizer.  Avoid contact with people who have cold symptoms.  Try not to touch your hands to your mouth, nose, or eyes.  Make sure to get the flu shot every year.  Contact a doctor if:  Your symptoms do not get better in 2 weeks. Get help right away if:  You cough up blood.  You have chest pain.  You have very bad shortness of breath.  You become dehydrated.  You faint (pass out) or keep feeling like you are going to pass out.  You keep throwing up (vomiting).  You have a very bad headache.  Your fever or chills gets worse. This information is not intended to replace advice given to you by your health care provider. Make sure you discuss any questions you have with your health care provider. Document Released: 11/19/2007 Document Revised: 01/09/2016 Document  Reviewed: 11/21/2015 Elsevier Interactive Patient Education  [employer].

## 2017-06-17 NOTE — Progress Notes (Signed)
Subjective:    Patient ID: Cindy Yang, female    DOB: Apr 28, 1956, 62 y.o.   MRN: [id number]  HPI 62 yo female in non acute distress comes in today for cough Started about  8 days ago productive cough yellow.  Smoking 1ppd,  Chest tightness. Had fever and chills initially. Nasal and chest congestion.   Review of Systems  Constitutional: Positive for chills, fatigue and fever.  HENT: Positive for congestion, rhinorrhea, sinus pressure and sinus pain. Negative for ear discharge, ear pain and sore throat.   Eyes: Negative for discharge and itching.  Respiratory: Positive for cough and wheezing. Negative for shortness of breath.   Cardiovascular: Negative for chest pain.  Gastrointestinal: Negative for abdominal pain.  Endocrine: Negative for polydipsia, polyphagia and polyuria.  Genitourinary: Negative for dysuria.  Musculoskeletal: Negative for myalgias.  Skin: Negative for rash.  Allergic/Immunologic: Positive for environmental allergies. Negative for food allergies.  Neurological: Negative for dizziness, syncope and light-headedness.  Hematological: Negative for adenopathy.  Psychiatric/Behavioral: Negative for behavioral problems, self-injury and suicidal ideas. The patient is not nervous/anxious.        Objective:   Physical Exam  Constitutional: She appears well-developed and well-nourished.  HENT:  Head: Normocephalic and atraumatic.  Right Ear: External ear normal. A middle ear effusion is present.  Left Ear: External ear normal. A middle ear effusion is present.  Nose: Mucosal edema and rhinorrhea present.  Mouth/Throat: Uvula is midline, oropharynx is clear and moist and mucous membranes are normal.  Eyes: Conjunctivae and EOM are normal. Pupils are equal, round, and reactive to light.  Neck: Normal range of motion. Neck supple.  Cardiovascular: Normal rate, regular rhythm and normal heart sounds. Exam reveals no gallop and no friction rub.  No murmur  heard. Pulmonary/Chest: Effort normal. She has no wheezes.  Skin: Skin is warm and dry.  Psychiatric: She has a normal mood and affect. Her speech is normal and behavior is normal. Judgment and thought content normal. Cognition and memory are normal.  Nursing note and vitals reviewed.  Cobblestoning posterior pharnyx        Assessment & Plan:  Bronchitis / Cough Smoker Meds ordered this encounter  Medications  . amoxicillin-clavulanate (AUGMENTIN) 875-125 MG tablet    Sig: Take 1 tablet by mouth 2 (two) times daily.    Dispense:  20 tablet    Refill:  0  . benzonatate (TESSALON PERLES) 100 MG capsule    Sig: Take 1 capsule (100 mg total) by mouth 3 (three) times daily as needed.    Dispense:  30 capsule    Refill:  0  . ipratropium-albuterol (DUONEB) 0.5-2.5 (3) MG/3ML nebulizer solution 3 mL  Friday follow up if not improving. Meds ordered this encounter  Medications  . amoxicillin-clavulanate (AUGMENTIN) 875-125 MG tablet    Sig: Take 1 tablet by mouth 2 (two) times daily.    Dispense:  20 tablet    Refill:  0  . benzonatate (TESSALON PERLES) 100 MG capsule    Sig: Take 1 capsule (100 mg total) by mouth 3 (three) times daily as needed.    Dispense:  30 capsule    Refill:  0  . ipratropium-albuterol (DUONEB) 0.5-2.5 (3) MG/3ML nebulizer solution 3 mL  . albuterol (PROVENTIL HFA;VENTOLIN HFA) 108 (90 Base) MCG/ACT inhaler    Sig: Inhale 2 puffs into the lungs every 6 (six) hours as needed for wheezing or shortness of breath. Ventolin inhaler    Dispense:  1 Inhaler  Refill:  0  . predniSONE (STERAPRED UNI-PAK 21 TAB) 10 MG (21) TBPK tablet    Sig: Take 6 tablets by mouth today then  5 tablets tomorrow then one less every day thereafter.    Dispense:  21 tablet    Refill:  0  Cut down on smoking as much as possible. Rest and increase fluids. Patient verbalizes understanding and has no questions at discharge.

## 2017-10-06 ENCOUNTER — Other Ambulatory Visit: Payer: Self-pay | Admitting: Medical

## 2017-10-06 DIAGNOSIS — Z1231 Encounter for screening mammogram for malignant neoplasm of breast: Secondary | ICD-10-CM

## 2017-10-13 ENCOUNTER — Ambulatory Visit
Admission: RE | Admit: 2017-10-13 | Discharge: 2017-10-13 | Disposition: A | Discharge status: Home / Self Care | Payer: BLUE CROSS/BLUE SHIELD | Source: Ambulatory Visit | Attending: Medical | Admitting: Medical

## 2017-10-13 DIAGNOSIS — Z1231 Encounter for screening mammogram for malignant neoplasm of breast: Secondary | ICD-10-CM | POA: Diagnosis not present

## 2017-11-23 IMAGING — CR DG FEMUR 2+V*L*
1 series · 4 of 4 positions shown · non-contrast
Comparison: No recent prior.

CLINICAL DATA: No injury.  Pain left groin.

EXAM:
LEFT FEMUR 2 VIEWS

[Series 1: dg femur min 2 views left · 0.14mm/px · 4 of 4 slices shown]
[im 1/4]
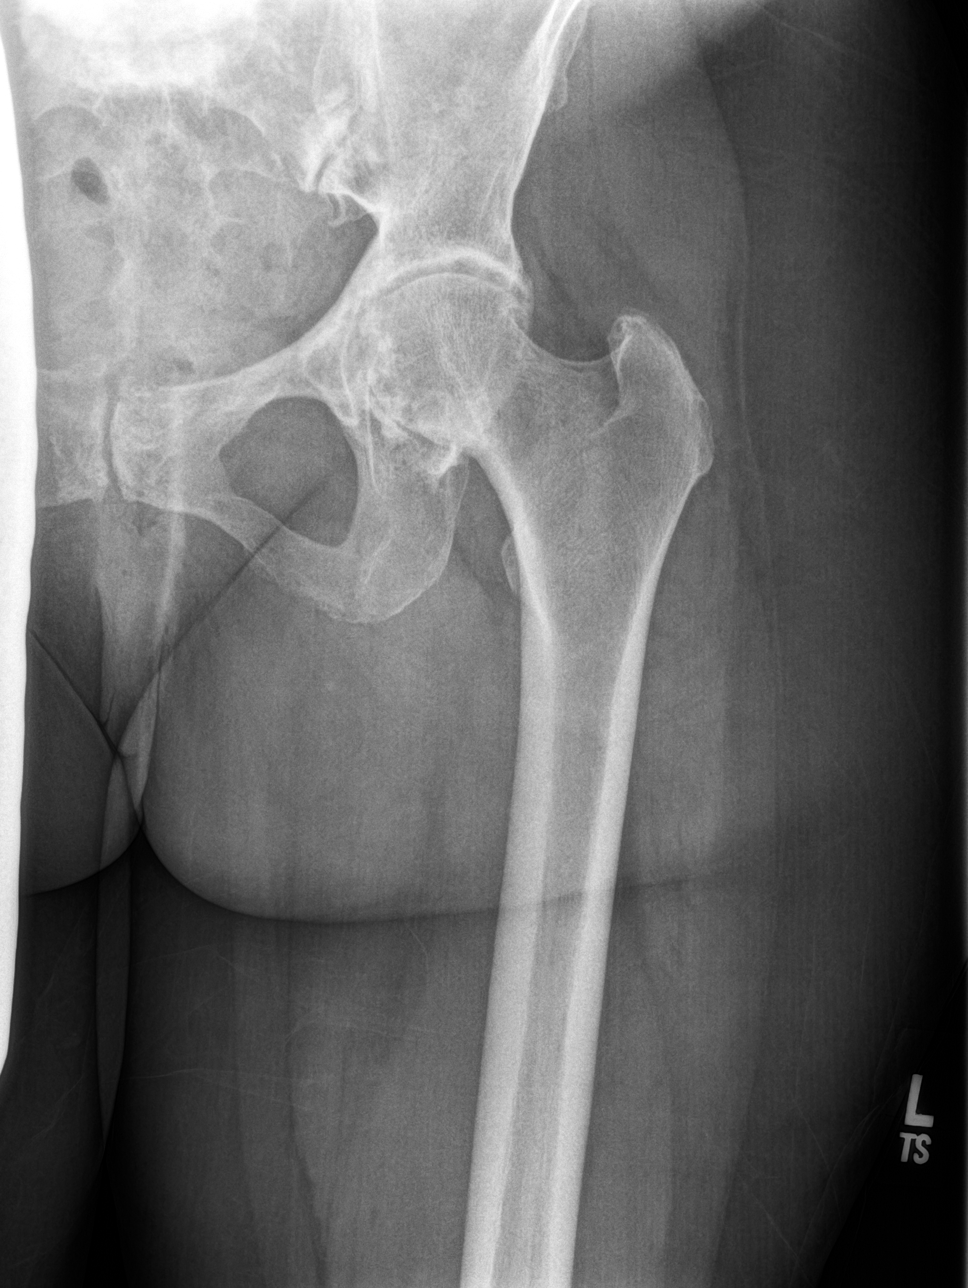
[im 2/4]
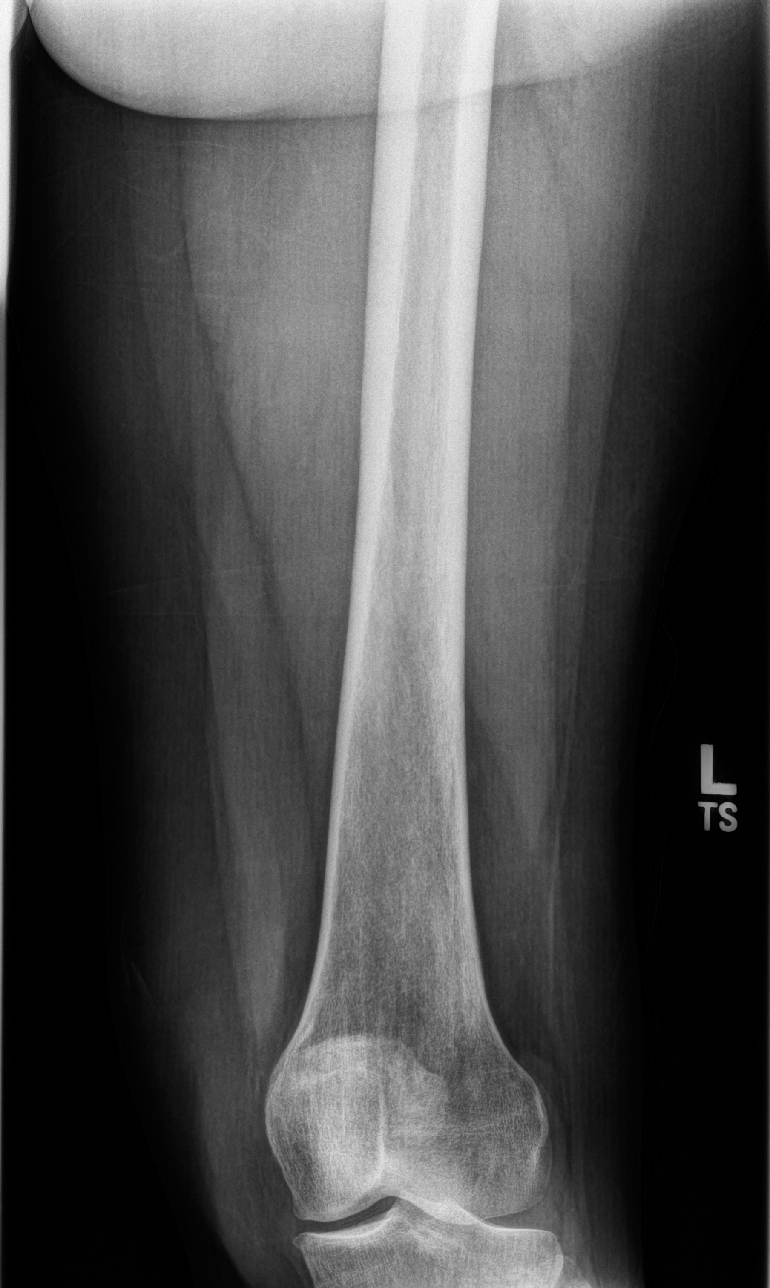
[im 3/4]
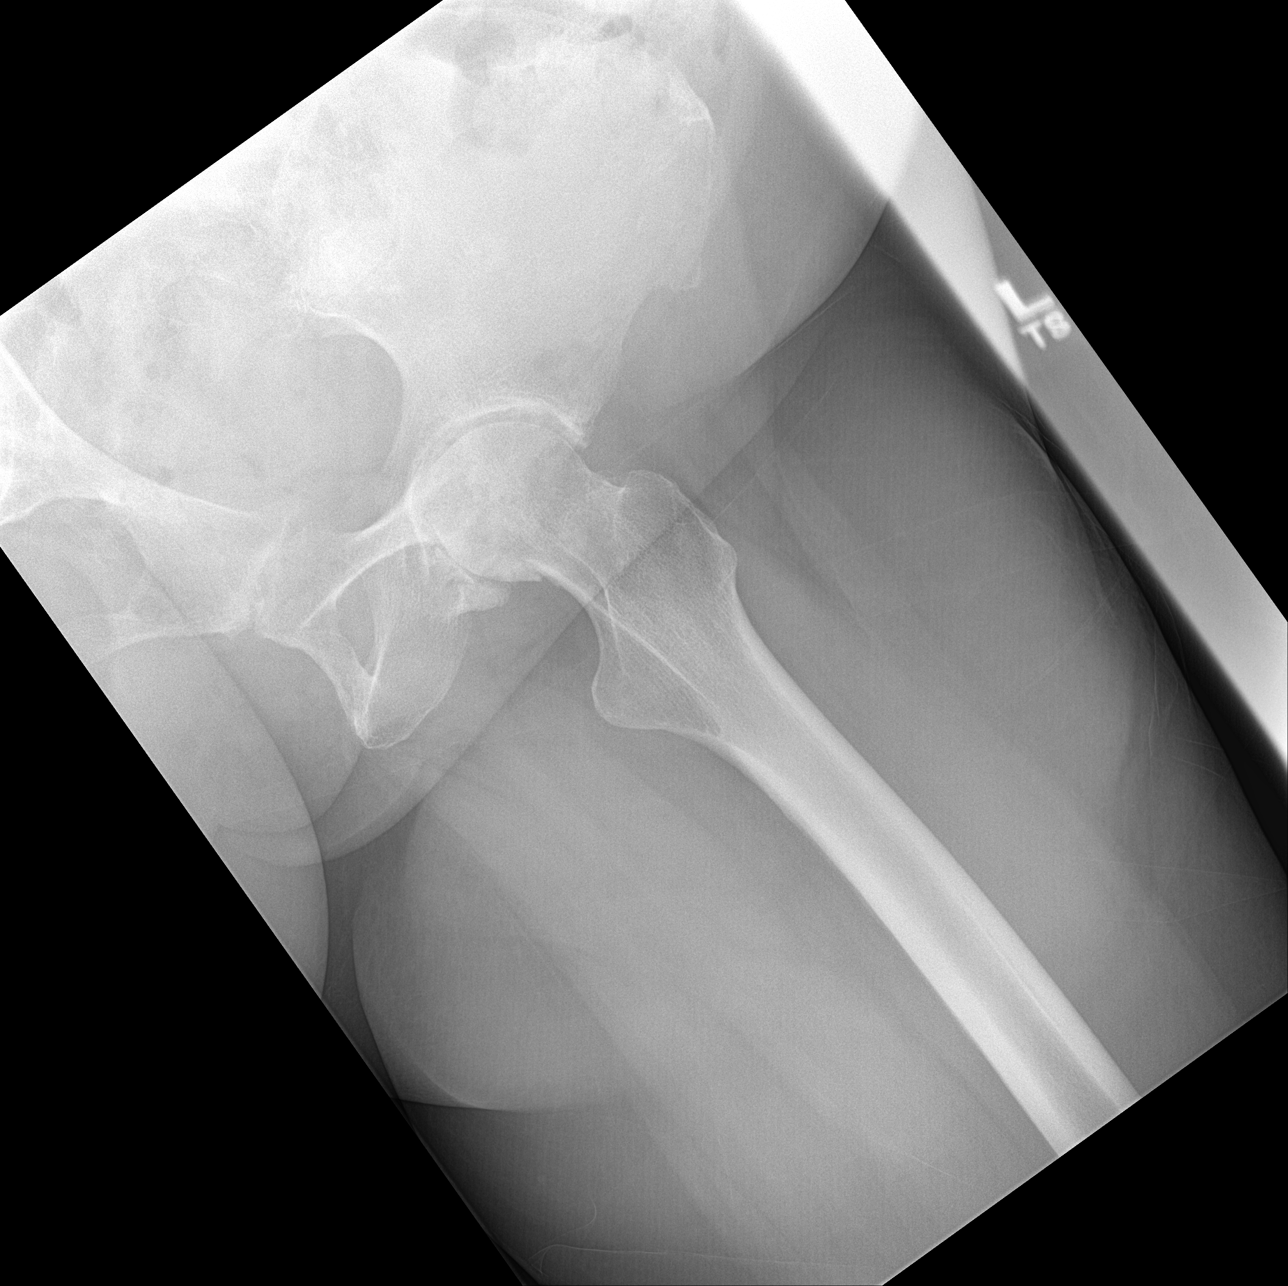
[im 4/4]
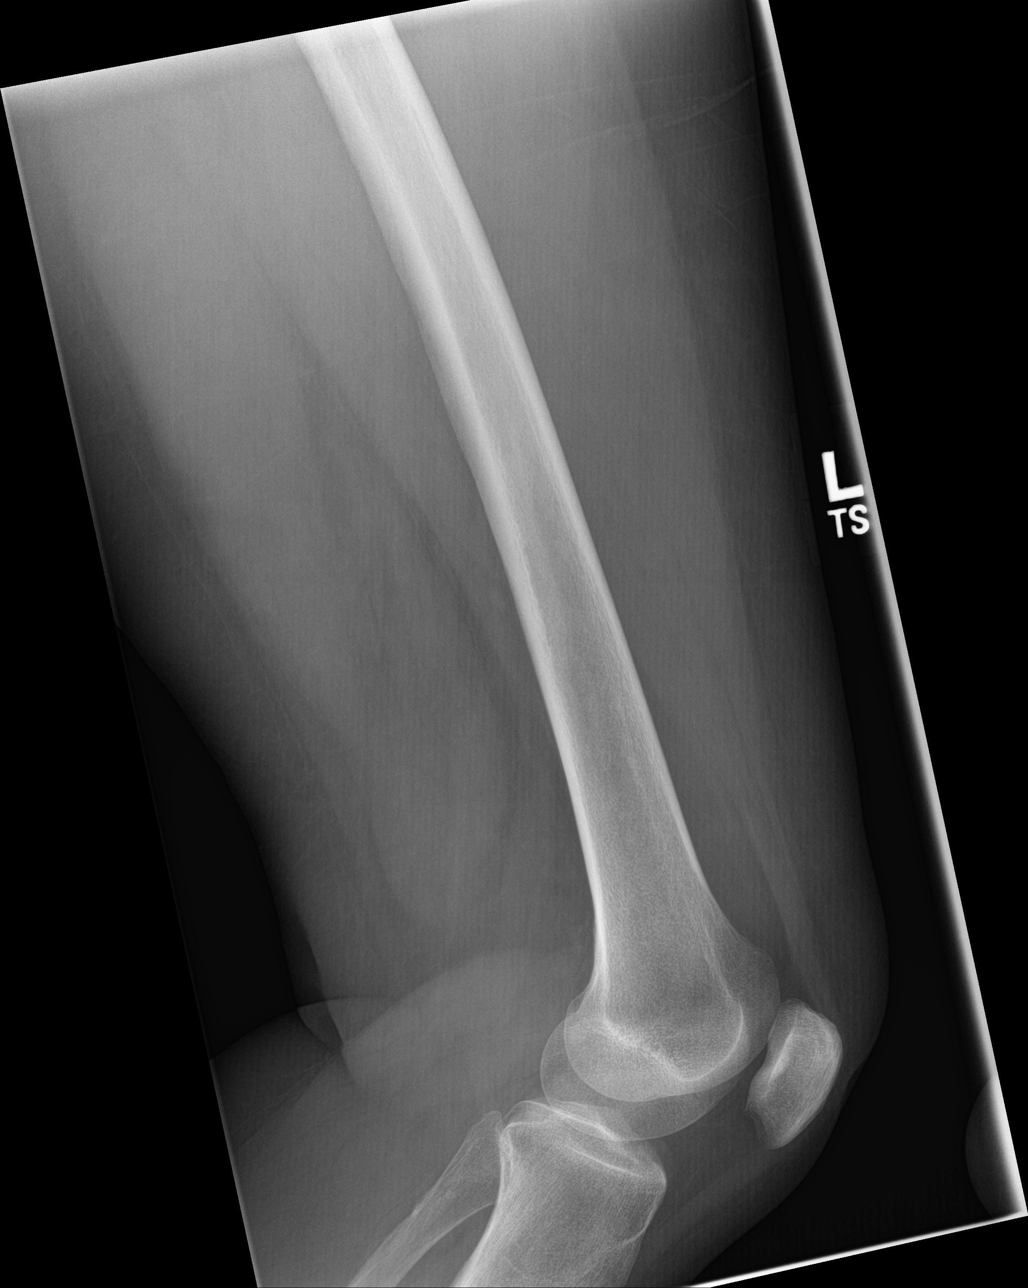

[4 of 4 positions shown; findings below may reference images not displayed]

FINDINGS: Severe degenerative changes left hip. No definite fracture. The
dislocation. No focal bony abnormalities identified. Degenerative
changes in the lumbosacral spine and left SI joint. No evidence of
fracture or dislocation. No focal soft tissue abnormality identified
.
IMPRESSION: Severe degenerative changes left hip. Degenerative changes
lumbosacral spine and left SI joint. No definite fracture. No
dislocation.

## 2018-02-08 ENCOUNTER — Ambulatory Visit: Payer: Self-pay | Admitting: Medical

## 2018-02-08 ENCOUNTER — Encounter: Payer: Self-pay | Admitting: Medical

## 2018-02-08 ENCOUNTER — Other Ambulatory Visit: Payer: Self-pay | Admitting: Medical

## 2018-02-08 VITALS — BP 131/66 | HR 85 | Temp 97.7°F | Resp 18 | Wt 149.6 lb

## 2018-02-08 DIAGNOSIS — R062 Wheezing: Secondary | ICD-10-CM

## 2018-02-08 DIAGNOSIS — R059 Cough, unspecified: Secondary | ICD-10-CM

## 2018-02-08 DIAGNOSIS — Z0000 Encounter for general adult medical examination without abnormal findings: Secondary | ICD-10-CM

## 2018-02-08 DIAGNOSIS — J40 Bronchitis, not specified as acute or chronic: Secondary | ICD-10-CM

## 2018-02-08 DIAGNOSIS — R05 Cough: Secondary | ICD-10-CM

## 2018-02-08 MED ORDER — PREDNISONE 10 MG (21) PO TBPK: ORAL_TABLET | ORAL | 0 refills | Status: DC

## 2018-02-08 MED ORDER — IPRATROPIUM-ALBUTEROL 0.5-2.5 (3) MG/3ML IN SOLN: 3.0000 mL | Freq: Once | RESPIRATORY_TRACT | Status: DC

## 2018-02-08 MED ORDER — ALBUTEROL SULFATE HFA 108 (90 BASE) MCG/ACT IN AERS: 2.0000 | INHALATION_SPRAY | Freq: Four times a day (QID) | RESPIRATORY_TRACT | 0 refills | Status: DC | PRN

## 2018-02-08 MED ORDER — BENZONATATE 100 MG PO CAPS: 100.0000 mg | ORAL_CAPSULE | Freq: Three times a day (TID) | ORAL | 0 refills | Status: DC | PRN

## 2018-02-08 MED ORDER — AZITHROMYCIN 250 MG PO TABS: ORAL_TABLET | ORAL | 0 refills | Status: DC

## 2018-02-08 NOTE — Progress Notes (Addendum)
Subjective:    Patient ID: Cindy Yang, female    DOB: [birth year], 62 y.o.   MRN: [id number]  HPI  62 yo female in non acute distress. Started on Thursday with cough productive yellow, nwo is clear Smokes 1 ppd, x 40 yearsd taking  Robitussiin  Cough and cold.helps a little.  Blood pressure 131/66, pulse 85, temperature 97.7 F (36.5 C), temperature source Tympanic, resp. rate 18, weight 149 lb 9.6 oz (67.9 kg), SpO2 94 %.  Review of Systems  Constitutional: Positive for appetite change (decreased) and chills. Negative for fatigue and fever.  HENT: Positive for postnasal drip, rhinorrhea (clear) and sneezing. Negative for ear discharge, ear pain, sinus pressure, sinus pain and sore throat.   Eyes: Negative for discharge, itching and visual disturbance.  Respiratory: Positive for cough, chest tightness ("little bit"), shortness of breath and wheezing.   Cardiovascular: Negative for chest pain, palpitations and leg swelling.  Gastrointestinal: Negative for abdominal pain.  Endocrine: Negative for polydipsia, polyphagia and polyuria.  Genitourinary: Negative for dysuria.  Musculoskeletal: Negative for myalgias.  Skin: Negative for rash.  Allergic/Immunologic: Positive for environmental allergies and food allergies. Negative for immunocompromised state.  Neurological: Positive for headaches (on Thursday on forehead but resolved.). Negative for dizziness, syncope and light-headedness.  Hematological: Negative for adenopathy.  Psychiatric/Behavioral: Negative for behavioral problems, confusion, self-injury and suicidal ideas.       Objective:   Physical Exam  Constitutional: She is oriented to person, place, and time. She appears well-developed and well-nourished.  HENT:  Head: Normocephalic and atraumatic.  Eyes: Pupils are equal, round, and reactive to light. Conjunctivae and EOM are normal.  Neck: Neck supple.  Cardiovascular: Normal rate and regular rhythm.  Pulmonary/Chest:  Effort normal. No stridor. No tachypnea. No respiratory distress. She has wheezes. She has rhonchi. She has no rales.  Lymphadenopathy:    She has no cervical adenopathy.  Neurological: She is alert and oriented to person, place, and time.  Skin: Skin is warm and dry.  Psychiatric: She has a normal mood and affect. Her behavior is normal. Judgment and thought content normal.  Nursing note and vitals reviewed.    No cough noted in room Able to take deep breaths.  Duoneb s/p O2 sat 93-97% noted Ronchi all  4 quarants and mild wheeze on inspiration. But much improved. Able to talk in full sentences.    Assessment & Plan:  Viral Bronchitis Meds ordered this encounter  Medications  . albuterol (PROVENTIL HFA;VENTOLIN HFA) 108 (90 Base) MCG/ACT inhaler    Sig: Inhale 2 puffs into the lungs every 6 (six) hours as needed for wheezing or shortness of breath. Please supply Ventolin    Dispense:  1 Inhaler    Refill:  0  . predniSONE (STERAPRED UNI-PAK 21 TAB) 10 MG (21) TBPK tablet    Sig: Take  6 tablets by mouth today,  5 tables tomorrow then one less each day there after.    Dispense:  21 tablet    Refill:  0  . azithromycin (ZITHROMAX) 250 MG tablet    Sig: Take 2 tablets by mouth today then one tablet days 2-5, take with food    Dispense:  6 tablet    Refill:  0  . benzonatate (TESSALON PERLES) 100 MG capsule    Sig: Take 1 capsule (100 mg total) by mouth 3 (three) times daily as needed for cough.    Dispense:  30 capsule    Refill:  0  OTC  Mucinex  Take as directed. Start Azithromycin if color discharge occurs.  Start Azithromycin if change in color of sputum or fever 100.5 or higher. Rest and increase fluids. She declines work note.  Try to cut smoking in 1/2 while sick.  Return on Thursday for recheck, sooner if shortness of breath or chest pain occurs or any other concerns. Patient feeling better after breathing treatment. She verbalizes understanding and has no questions at  discharge. After patient is well she is to schedule an annual exam and fasting  blood work (orders placed in [employer]).

## 2018-02-08 NOTE — Patient Instructions (Addendum)
Try to cut down on smoking. Return to th office on Thursday for a recheck.  Health Risks of Smoking Smoking cigarettes is very bad for your health. Tobacco smoke has over 200 known poisons in it. It contains the poisonous gases nitrogen oxide and carbon monoxide. There are over 60 chemicals in tobacco smoke that cause cancer. Smoking is difficult to quit because a chemical in tobacco, called nicotine, causes addiction or dependence. When you smoke and inhale, nicotine is absorbed rapidly into the bloodstream through your lungs. Both inhaled and non-inhaled nicotine may be addictive. What are the risks of cigarette smoke? Cigarette smokers have an increased risk of many serious medical problems, including:  Lung cancer.  Lung disease, such as pneumonia, bronchitis, and emphysema.  Chest pain (angina) and heart attack because the heart is not getting enough oxygen.  Heart disease and peripheral blood vessel disease.  High blood pressure (hypertension).  Stroke.  Oral cancer, including cancer of the lip, mouth, or voice box.  Bladder cancer.  Pancreatic cancer.  Cervical cancer.  Pregnancy complications, including premature birth.  Stillbirths and smaller newborn babies, birth defects, and genetic damage to sperm.  Early menopause.  Lower estrogen level for women.  Infertility.  Facial wrinkles.  Blindness.  Increased risk of broken bones (fractures).  Senile dementia.  Stomach ulcers and internal bleeding.  Delayed wound healing and increased risk of complications during surgery.  Even smoking lightly shortens your life expectancy by several years.  Because of secondhand smoke exposure, children of smokers have an increased risk of the following:  Sudden infant death syndrome (SIDS).  Respiratory infections.  Lung cancer.  Heart disease.  Ear infections.  What are the benefits of quitting? There are many health benefits of quitting smoking. Here are  some of them:  Within days of quitting smoking, your risk of having a heart attack decreases, your blood flow improves, and your lung capacity improves. Blood pressure, pulse rate, and breathing patterns start returning to normal soon after quitting.  Within months, your lungs may clear up completely.  Quitting for 10 years reduces your risk of developing lung cancer and heart disease to almost that of a nonsmoker.  People who quit may see an improvement in their overall quality of life.  How do I quit smoking? Smoking is an addiction with both physical and psychological effects, and longtime habits can be hard to change. Your health care provider can recommend:  Programs and community resources, which may include group support, education, or talk therapy.  Prescription medicines to help reduce cravings.  Nicotine replacement products, such as patches, gum, and nasal sprays. Use these products only as directed. Do not replace cigarette smoking with electronic cigarettes, which are commonly called e-cigarettes. The safety of e-cigarettes is not known, and some may contain harmful chemicals.  A combination of two or more of these methods.  Where to find more information:  American Lung Association: www.lung.org  American Cancer Society: www.cancer.org Summary  Smoking cigarettes is very bad for your health. Cigarette smokers have an increased risk of many serious medical problems, including several cancers, heart disease, and stroke.  Smoking is an addiction with both physical and psychological effects, and longtime habits can be hard to change.  By stopping right away, you can greatly reduce the risk of medical problems for you and your family.  To help you quit smoking, your health care provider can recommend programs, community resources, prescription medicines, and nicotine replacement products such as patches, gum, and  nasal sprays. This information is not intended to replace  advice given to you by your health care provider. Make sure you discuss any questions you have with your health care provider. Document Released: 07/10/2004 Document Revised: 06/06/2016 Document Reviewed: 06/06/2016 Elsevier Interactive Patient Education  2017 [employer]  Acute Bronchitis, Adult Acute bronchitis is when air tubes (bronchi) in the lungs suddenly get swollen. The condition can make it hard to breathe. It can also cause these symptoms:  A cough.  Coughing up clear, yellow, or green mucus.  Wheezing.  Chest congestion.  Shortness of breath.  A fever.  Body aches.  Chills.  A sore throat.  Follow these instructions at home: Medicines  Take over-the-counter and prescription medicines only as told by your doctor.  If you were prescribed an antibiotic medicine, take it as told by your doctor. Do not stop taking the antibiotic even if you start to feel better. General instructions  Rest.  Drink enough fluids to keep your pee (urine) clear or pale yellow.  Avoid smoking and secondhand smoke. If you smoke and you need help quitting, ask your doctor. Quitting will help your lungs heal faster.  Use an inhaler, cool mist vaporizer, or humidifier as told by your doctor.  Keep all follow-up visits as told by your doctor. This is important. How is this prevented? To lower your risk of getting this condition again:  Wash your hands often with soap and water. If you cannot use soap and water, use hand sanitizer.  Avoid contact with people who have cold symptoms.  Try not to touch your hands to your mouth, nose, or eyes.  Make sure to get the flu shot every year.  Contact a doctor if:  Your symptoms do not get better in 2 weeks. Get help right away if:  You cough up blood.  You have chest pain.  You have very bad shortness of breath.  You become dehydrated.  You faint (pass out) or keep feeling like you are going to pass out.  You keep throwing up  (vomiting).  You have a very bad headache.  Your fever or chills gets worse. This information is not intended to replace advice given to you by your health care provider. Make sure you discuss any questions you have with your health care provider. Document Released: 11/19/2007 Document Revised: 01/09/2016 Document Reviewed: 11/21/2015 Elsevier Interactive Patient Education  [employer].

## 2018-02-08 NOTE — Progress Notes (Signed)
Labs placed for primary care appointment.

## 2018-02-11 ENCOUNTER — Encounter: Payer: Self-pay | Admitting: Adult Health

## 2018-02-11 ENCOUNTER — Ambulatory Visit: Payer: Self-pay | Admitting: Adult Health

## 2018-02-11 VITALS — BP 142/71 | HR 66 | Temp 97.8°F | Resp 16 | Ht 67.0 in | Wt 153.0 lb

## 2018-02-11 DIAGNOSIS — Z87891 Personal history of nicotine dependence: Secondary | ICD-10-CM | POA: Insufficient documentation

## 2018-02-11 DIAGNOSIS — F17200 Nicotine dependence, unspecified, uncomplicated: Secondary | ICD-10-CM | POA: Insufficient documentation

## 2018-02-11 DIAGNOSIS — J40 Bronchitis, not specified as acute or chronic: Secondary | ICD-10-CM | POA: Insufficient documentation

## 2018-02-11 NOTE — Patient Instructions (Signed)

## 2018-02-11 NOTE — Progress Notes (Signed)
Subjective:     Patient ID: Cindy Yang, female   DOB: 08-25-1955, 62 y.o.   MRN: [id number]  HPI   Blood pressure (!) 142/71, pulse 66, temperature 97.8 F (36.6 C), resp. rate 16, height 5\' 7"  (1.702 m), weight 153 lb (69.4 kg), SpO2 98 %.  Patient is a 62 year old female who comes oin today for recheck after seeing colleague Talmage Nap, PA-Con 02/08/18 with diagnosis of Bronchitis.  She reports she got her antibiotic but did not start it as her congestion improved. She feels she still has mild congestion though it is improved. She states " I feel better". She has been drinking well.  She is a smoker. Clear sputum only with cough. Appetite is improved. Chest tightness and wheezing has resolved. She is not using Mucinex.  She has been using Benzonatate and Prednisone. She has not had to use inhaler.  Patient  denies any fever, body aches,chills, rash, chest pain, nausea, vomiting, or diarrhea.  Denies any recent hospitalization.   Social History   Socioeconomic History  . Marital status: Married    Spouse name: Not on file  . Number of children: Not on file  . Years of education: Not on file  . Highest education level: Not on file  Occupational History  . Not on file  Social Needs  . Financial resource strain: Not on file  . Food insecurity:    Worry: Not on file    Inability: Not on file  . Transportation needs:    Medical: Not on file    Non-medical: Not on file  Tobacco Use  . Smoking status: Heavy Tobacco Smoker    Packs/day: 1.00    Years: 40.00    Pack years: 40.00    Types: Cigarettes    Start date: 09/25/1973  . Smokeless tobacco: Never Used  Substance and Sexual Activity  . Alcohol use: No  . Drug use: No  . Sexual activity: Yes  Lifestyle  . Physical activity:    Days per week: Not on file    Minutes per session: Not on file  . Stress: Not on file  Relationships  . Social connections:    Talks on phone: Not on file    Gets together: Not on  file    Attends religious service: Not on file    Active member of club or organization: Not on file    Attends meetings of clubs or organizations: Not on file    Relationship status: Not on file  . Intimate partner violence:    Fear of current or ex partner: Not on file    Emotionally abused: Not on file    Physically abused: Not on file    Forced sexual activity: Not on file  Other Topics Concern  . Not on file  Social History Narrative  . Not on file    Review of Systems  Constitutional: Positive for activity change (working today without any distress she reports. ) and appetite change (improved ).  HENT: Positive for postnasal drip and rhinorrhea. Negative for congestion, dental problem, drooling, ear discharge, ear pain, facial swelling, hearing loss, mouth sores, nosebleeds, sinus pressure, sinus pain, sneezing, sore throat, tinnitus, trouble swallowing and voice change.   Eyes: Negative.   Respiratory: Positive for cough. Negative for apnea, choking, chest tightness, shortness of breath, wheezing and stridor.   Cardiovascular: Negative.   Gastrointestinal: Negative.   Endocrine: Negative.   Genitourinary: Negative.   Musculoskeletal: Negative.  Skin: Negative.   Allergic/Immunologic: Negative.   Neurological: Negative.   Hematological: Negative.   Psychiatric/Behavioral: Negative.        Objective:   Physical Exam  Constitutional: She is oriented to person, place, and time. Vital signs are normal. She appears well-developed and well-nourished. She is active.  Non-toxic appearance. She does not have a sickly appearance. She does not appear ill.  HENT:  Head: Normocephalic and atraumatic.  Right Ear: Hearing, external ear and ear canal normal. Tympanic membrane is not perforated, not erythematous and not retracted. A middle ear effusion is present.  Left Ear: Hearing, external ear and ear canal normal. Tympanic membrane is not perforated, not erythematous and not  retracted. A middle ear effusion is present.  Nose: Rhinorrhea present. No mucosal edema. Right sinus exhibits no maxillary sinus tenderness and no frontal sinus tenderness. Left sinus exhibits no maxillary sinus tenderness and no frontal sinus tenderness.  Mouth/Throat: Uvula is midline, oropharynx is clear and moist and mucous membranes are normal. No oropharyngeal exudate.  She reports she removed ear wax at home left ear with soft tissue. Ear is clear.   Eyes: Pupils are equal, round, and reactive to light. Conjunctivae and EOM are normal. Right eye exhibits no discharge. Left eye exhibits no discharge. No scleral icterus.  Neck: Normal range of motion. Neck supple. No JVD present. No tracheal deviation present.  Cardiovascular: Normal rate, regular rhythm, normal heart sounds and intact distal pulses. Exam reveals no gallop and no friction rub.  No murmur heard. Pulmonary/Chest: Effort normal and breath sounds normal. No accessory muscle usage or stridor. No apnea, no tachypnea and no bradypnea. No respiratory distress. She has no wheezes. She has no rales. She exhibits no tenderness.  Abdominal: Soft. Normal appearance and bowel sounds are normal.  Musculoskeletal: Normal range of motion.  Lymphadenopathy:       Head (right side): No submental, no submandibular, no tonsillar, no preauricular, no posterior auricular and no occipital adenopathy present.       Head (left side): No submental, no submandibular, no tonsillar, no preauricular, no posterior auricular and no occipital adenopathy present.    She has no cervical adenopathy.  Neurological: She is alert and oriented to person, place, and time. She has normal strength. She displays normal reflexes. No cranial nerve deficit. She exhibits normal muscle tone. Coordination normal.  Skin: Skin is warm, dry and intact. Capillary refill takes less than 2 seconds. No rash noted. She is not diaphoretic. No erythema. No pallor.  Psychiatric: She has  a normal mood and affect. Her speech is normal and behavior is normal. Judgment and thought content normal.  Vitals reviewed.      Assessment:     Bronchitis  Smoker  RESOLVING BRONCHITIS     Plan:     Continue current plan from previous office visit 8/26 with PCP Ratcliffe, Nira Conn R, PA-C OTC Mucinex  Take as directed. Start Azithromycin if color discharge occurs.  Start Azithromycin if change in color of sputum or fever 100.5 or higher. Rest and increase fluids.  She reports she did not get inhaler due to cost- provider will print a [employer] coupon for patient and she will pick up today so she can have inhaler on hand for weekend. Hydrate. May add Mucinex. Discussed need for smoking cessation--patient verbalizes understanding of risks versus benefits however not ready to quit at this time. She has tried to decrease smoking some while sick but is still smoking.  Return to office  for recheck by calling during office hours or seek medical treatment at urgent care ore emergency room if worsens over the weekend or  if shortness of breath or chest pain occurs or any other concerns. She verbalizes understanding and has no questions at discharge. After patient is well she is to schedule an annual exam and fasting  blood work (orders placed in [place] already by PCP)  Also meets guidelkines with 40 year smoking history for low dose CT scan will discuss with Talmage Nap, PA-C and she can discuss with patient at time of physical. Advised patient call the office or your primary care doctor for an appointment if no improvement within 72 hours or if any symptoms change or worsen at any time  Advised ER or urgent Care if after hours or on weekend. Call 911 for emergency symptoms at any time.Patinet verbalized understanding of all instructions given/reviewed and treatment plan and has no further questions or concerns at this time.    Patient verbalized understanding of all instructions given and  denies any further questions at this time.

## 2018-08-10 ENCOUNTER — Encounter: Payer: Self-pay | Admitting: Medical

## 2018-08-10 ENCOUNTER — Ambulatory Visit: Payer: Self-pay | Admitting: Medical

## 2018-08-10 VITALS — BP 133/74 | HR 78 | Temp 99.1°F | Resp 18 | Wt 160.0 lb

## 2018-08-10 DIAGNOSIS — Z0000 Encounter for general adult medical examination without abnormal findings: Secondary | ICD-10-CM

## 2018-08-10 DIAGNOSIS — Z1159 Encounter for screening for other viral diseases: Secondary | ICD-10-CM

## 2018-08-10 DIAGNOSIS — R05 Cough: Secondary | ICD-10-CM

## 2018-08-10 DIAGNOSIS — R059 Cough, unspecified: Secondary | ICD-10-CM

## 2018-08-10 DIAGNOSIS — Z114 Encounter for screening for human immunodeficiency virus [HIV]: Secondary | ICD-10-CM

## 2018-08-10 DIAGNOSIS — J40 Bronchitis, not specified as acute or chronic: Secondary | ICD-10-CM

## 2018-08-10 NOTE — Progress Notes (Signed)
Subjective:    Patient ID: Cindy Yang, female    DOB: February 02, 1956, 63 y.o.   MRN: [id number]  HPI  63 yo female in non acute distress. Started on Friday with aching all over and coughing non productive.  Sweating at night started Friday. Possibly a fever but not sure. No shortness of breath or chest pain.smoked only  4 cigarettes today, usually smokes  1ppd.   Blood pressure 133/74, pulse 78, temperature 99.1 F (37.3 C), temperature source Tympanic, resp. rate 18, weight 160 lb (72.6 kg), SpO2 98 %.  No Known Allergies   Smoker 1 ppd smoking started at 63 yo  Current Outpatient Medications:  .  Phenyleph-CPM-DM-APAP (ALKA-SELTZER PLUS COLD & FLU PO), Take by mouth., Disp: , Rfl:  .  albuterol (PROVENTIL HFA;VENTOLIN HFA) 108 (90 Base) MCG/ACT inhaler, Inhale 2 puffs into the lungs every 6 (six) hours as needed for wheezing or shortness of breath. Please supply Ventolin (Patient not taking: Reported on 02/11/2018), Disp: 1 Inhaler, Rfl: 0  Current Facility-Administered Medications:  .  ipratropium-albuterol (DUONEB) 0.5-2.5 (3) MG/3ML nebulizer solution 3 mL, 3 mL, Nebulization, Once, ,  R, PA-C  Review of Systems  Constitutional: Positive for fatigue. Negative for chills and fever.  HENT: Positive for postnasal drip (little bit) and rhinorrhea (beige). Negative for congestion, ear pain, sinus pressure, sinus pain and sore throat.   Eyes: Negative for discharge and itching.  Respiratory: Positive for cough, chest tightness (little bit) and wheezing (little bit). Negative for shortness of breath.   Cardiovascular: Negative for chest pain, palpitations and leg swelling.  Gastrointestinal: Negative for abdominal pain, diarrhea, nausea and vomiting.  Musculoskeletal: Positive for myalgias (mild).  Skin: Negative for rash.   Did get the flu vaccine    using alka setlzer cold and flu. Objective:   Physical Exam Vitals signs and nursing note reviewed.   Constitutional:      Appearance: Normal appearance. She is normal weight.  HENT:     Head: Normocephalic and atraumatic.     Jaw: There is normal jaw occlusion.     Right Ear: Hearing, ear canal and external ear normal. A middle ear effusion is present.     Left Ear: Hearing, ear canal and external ear normal. A middle ear effusion is present.     Nose: Congestion present.     Right Turbinates: Not enlarged, swollen or pale.     Left Turbinates: Not enlarged, swollen or pale.     Mouth/Throat:     Lips: Pink.     Mouth: Mucous membranes are moist.     Pharynx: Oropharynx is clear.     Tonsils: 2+ on the right. 2+ on the left.  Eyes:     Extraocular Movements: Extraocular movements intact.     Conjunctiva/sclera: Conjunctivae normal.     Pupils: Pupils are equal, round, and reactive to light.  Neck:     Musculoskeletal: Normal range of motion and neck supple.  Cardiovascular:     Rate and Rhythm: Normal rate and regular rhythm.  Pulmonary:     Effort: Pulmonary effort is normal.     Breath sounds: Normal breath sounds.  Skin:    General: Skin is warm and dry.  Neurological:     General: No focal deficit present.     Mental Status: She is alert and oriented to person, place, and time.  Psychiatric:        Mood and Affect: Mood normal.  Behavior: Behavior normal. Behavior is cooperative.        Thought Content: Thought content normal.        Judgment: Judgment normal.      coughing in room.  Assessment & Plan:  Bronchitis, Cough Patient has a Z-pak she has not taken reviewed instructions, she also has tessalon perles. Continue to decrease smoking while sick.. Permisson given to have HIV and Hep C screen.  OTC Mucinex as directed on the package.  Orders Placed This Encounter  Procedures  . Hepatitis c antibody (reflex)    Standing Status:   Future    Standing Expiration Date:   08/11/2019  . Urinalysis, Routine w reflex microscopic    Standing Status:   Future     Standing Expiration Date:   08/11/2019   Return in 3-5 days if not improving or if worsening . If clinic is not open to seek out medical care at an Urgent Care or the Emergency Departmemt. Patient verbalizes understanding and has no questions at discharge.

## 2018-08-10 NOTE — Patient Instructions (Signed)
Smoking Tobacco Information, Adult Smoking tobacco can be harmful to your health. Tobacco contains a poisonous (toxic), colorless chemical called nicotine. Nicotine is addictive. It changes the brain and can make it hard to stop smoking. Tobacco also has other toxic chemicals that can hurt your body and raise your risk of many cancers. How can smoking tobacco affect me? Smoking tobacco puts you at risk for:  Cancer. Smoking is most commonly associated with lung cancer, but can also lead to cancer in other parts of the body.  Chronic obstructive pulmonary disease (COPD). This is a long-term lung condition that makes it hard to breathe. It also gets worse over time.  High blood pressure (hypertension), heart disease, stroke, or heart attack.  Lung infections, such as pneumonia.  Cataracts. This is when the lenses in the eyes become clouded.  Digestive problems. This may include peptic ulcers, heartburn, and gastroesophageal reflux disease (GERD).  Oral health problems, such as gum disease and tooth loss.  Loss of taste and smell. Smoking can affect your appearance by causing:  Wrinkles.  Yellow or stained teeth, fingers, and fingernails. Smoking tobacco can also affect your social life, because:  It may be challenging to find places to smoke when away from home. Many workplaces, restaurants, hotels, and public places are tobacco-free.  Smoking is expensive. This is due to the cost of tobacco and the long-term costs of treating health problems from smoking.  Secondhand smoke may affect those around you. Secondhand smoke can cause lung cancer, breathing problems, and heart disease. Children of smokers have a higher risk for: ? Sudden infant death syndrome (SIDS). ? Ear infections. ? Lung infections. If you currently smoke tobacco, quitting now can help you:  Lead a longer and healthier life.  Look, smell, breathe, and feel better over time.  Save money.  Protect others from the  harms of secondhand smoke. What actions can I take to prevent health problems? Quit smoking   Do not start smoking. Quit if you already do.  Make a plan to quit smoking and commit to it. Look for programs to help you and ask your health care provider for recommendations and ideas.  Set a date and write down all the reasons you want to quit.  Let your friends and family know you are quitting so they can help and support you. Consider finding friends who also want to quit. It can be easier to quit with someone else, so that you can support each other.  Talk with your health care provider about using nicotine replacement medicines to help you quit, such as gum, lozenges, patches, sprays, or pills.  Do not replace cigarette smoking with electronic cigarettes, which are commonly called e-cigarettes. The safety of e-cigarettes is not known, and some may contain harmful chemicals.  If you try to quit but return to smoking, stay positive. It is common to slip up when you first quit, so take it one day at a time.  Be prepared for cravings. When you feel the urge to smoke, chew gum or suck on hard candy. Lifestyle  Stay busy and take care of your body.  Drink enough fluid to keep your urine pale yellow.  Get plenty of exercise and eat a healthy diet. This can help prevent weight gain after quitting.  Monitor your eating habits. Quitting smoking can cause you to have a larger appetite than when you smoke.  Find ways to relax. Go out with friends or family to a movie or a restaurant   where people do not smoke.  Ask your health care provider about having regular tests (screenings) to check for cancer. This may include blood tests, imaging tests, and other tests.  Find ways to manage your stress, such as meditation, yoga, or exercise. Where to find support To get support to quit smoking, consider:  Asking your health care provider for more information and resources.  Taking classes to learn  more about quitting smoking.  Looking for local organizations that offer resources about quitting smoking.  Joining a support group for people who want to quit smoking in your local community.  Calling the smokefree.gov counselor helpline: 1-800-Quit-Now (786) 028-3381) Where to find more information You may find more information about quitting smoking from:  HelpGuide.org: www.helpguide.org  https://hall.com/: smokefree.gov  American Lung Association: www.lung.org Contact a health care provider if you:  Have problems breathing.  Notice that your lips, nose, or fingers turn blue.  Have chest pain.  Are coughing up blood.  Feel faint or you pass out.  Have other health changes that cause you to worry. Summary  Smoking tobacco can negatively affect your health, the health of those around you, your finances, and your social life.  Do not start smoking. Quit if you already do. If you need help quitting, ask your health care provider.  Think about joining a support group for people who want to quit smoking in your local community. There are many effective programs that will help you to quit this behavior. This information is not intended to replace advice given to you by your health care provider. Make sure you discuss any questions you have with your health care provider. Document Released: 06/17/2016 Document Revised: 07/22/2017 Document Reviewed: 06/17/2016 Elsevier Interactive Patient Education  2019 [employer] Acute Bronchitis, Adult Acute bronchitis is when air tubes (bronchi) in the lungs suddenly get swollen. The condition can make it hard to breathe. It can also cause these symptoms:  A cough.  Coughing up clear, yellow, or green mucus.  Wheezing.  Chest congestion.  Shortness of breath.  A fever.  Body aches.  Chills.  A sore throat. Follow these instructions at home:  Medicines  Take over-the-counter and prescription medicines only as told by your  doctor.  If you were prescribed an antibiotic medicine, take it as told by your doctor. Do not stop taking the antibiotic even if you start to feel better. General instructions  Rest.  Drink enough fluids to keep your pee (urine) pale yellow.  Avoid smoking and secondhand smoke. If you smoke and you need help quitting, ask your doctor. Quitting will help your lungs heal faster.  Use an inhaler, cool mist vaporizer, or humidifier as told by your doctor.  Keep all follow-up visits as told by your doctor. This is important. How is this prevented? To lower your risk of getting this condition again:  Wash your hands often with soap and water. If you cannot use soap and water, use hand sanitizer.  Avoid contact with people who have cold symptoms.  Try not to touch your hands to your mouth, nose, or eyes.  Make sure to get the flu shot every year. Contact a doctor if:  Your symptoms do not get better in 2 weeks. Get help right away if:  You cough up blood.  You have chest pain.  You have very bad shortness of breath.  You become dehydrated.  You faint (pass out) or keep feeling like you are going to pass out.  You keep throwing  up (vomiting).  You have a very bad headache.  Your fever or chills gets worse. This information is not intended to replace advice given to you by your health care provider. Make sure you discuss any questions you have with your health care provider. Document Released: 11/19/2007 Document Revised: 01/14/2017 Document Reviewed: 11/21/2015 Elsevier Interactive Patient Education  2019 [employer].

## 2019-02-21 ENCOUNTER — Other Ambulatory Visit: Payer: Self-pay

## 2019-02-21 ENCOUNTER — Telehealth: Payer: Self-pay | Admitting: Medical

## 2019-02-21 DIAGNOSIS — Z8709 Personal history of other diseases of the respiratory system: Secondary | ICD-10-CM

## 2019-02-21 DIAGNOSIS — Z0000 Encounter for general adult medical examination without abnormal findings: Secondary | ICD-10-CM

## 2019-02-21 DIAGNOSIS — J302 Other seasonal allergic rhinitis: Secondary | ICD-10-CM

## 2019-02-21 DIAGNOSIS — J301 Allergic rhinitis due to pollen: Secondary | ICD-10-CM

## 2019-02-21 DIAGNOSIS — F17200 Nicotine dependence, unspecified, uncomplicated: Secondary | ICD-10-CM

## 2019-02-21 MED ORDER — AMOXICILLIN-POT CLAVULANATE 875-125 MG PO TABS: 1.0000 | ORAL_TABLET | Freq: Two times a day (BID) | ORAL | 0 refills | Status: DC

## 2019-02-21 MED ORDER — ALBUTEROL SULFATE HFA 108 (90 BASE) MCG/ACT IN AERS: 2.0000 | INHALATION_SPRAY | Freq: Four times a day (QID) | RESPIRATORY_TRACT | 0 refills | Status: DC | PRN

## 2019-02-21 NOTE — Telephone Encounter (Signed)
Permission to treat and evaluate through telemedicine.  No Known Allergies   Current Outpatient Medications:  .  albuterol (PROVENTIL HFA;VENTOLIN HFA) 108 (90 Base) MCG/ACT inhaler, Inhale 2 puffs into the lungs every 6 (six) hours as needed for wheezing or shortness of breath. Please supply Ventolin (Patient not taking: Reported on 02/11/2018), Disp: 1 Inhaler, Rfl: 0 .  albuterol (VENTOLIN HFA) 108 (90 Base) MCG/ACT inhaler, Inhale 2 puffs into the lungs every 6 (six) hours as needed for wheezing or shortness of breath. Hold for patient, she will get prescription if she needs it., Disp: 8 g, Rfl: 0 .  amoxicillin-clavulanate (AUGMENTIN) 875-125 MG tablet, Take 1 tablet by mouth 2 (two) times daily. Take with food, Disp: 10 tablet, Rfl: 0 .  Phenyleph-CPM-DM-APAP (ALKA-SELTZER PLUS COLD & FLU PO), Take by mouth., Disp: , Rfl:   Current Facility-Administered Medications:  .  ipratropium-albuterol (DUONEB) 0.5-2.5 (3) MG/3ML nebulizer solution 3 mL, 3 mL, Nebulization, Once, , Estill Dooms, PA-C  Ms. Gullickson is a  63 yo female approximately 1 ppd smoker. She has felt allergy like symptoms of a clear runny nose, mild cough x 2-3 days. Denies fever or shortness of breath, chest pain, no loss of taste or smell, no abdominal pan or diarrhea. She denies any other Covid -19 symptoms and denies any Covid-19 exposure.   She is concerned because she gets Bronchitis with her allergy symptoms. Per med record  2 x/year spring and fall.   Z-pak do not work for her.   Physical exam: None preformed due to telemedicine appointment.   Assessment/Plan Fall allergies Rhinorrhea/mild cough  Start Zyrtec or Claritin and OTC Flonase take as directed per package instuctuons.try to cut back on smoking.She currently does not have an Albuterol MDI. I will call in a prescription of Augmentin and an Albuterol MDI.  She is to start the antibioitic if fever or yellow/green prod cough or runny nose.  Meds ordered  this encounter  Medications  . amoxicillin-clavulanate (AUGMENTIN) 875-125 MG tablet    Sig: Take 1 tablet by mouth 2 (two) times daily. Take with food    Dispense:  10 tablet    Refill:  0  . albuterol (VENTOLIN HFA) 108 (90 Base) MCG/ACT inhaler    Sig: Inhale 2 puffs into the lungs every 6 (six) hours as needed for wheezing or shortness of breath. Hold for patient, she will get prescription if she needs it.    Dispense:  8 g    Refill:  0   She is to start inhaler if she begins to wheeze. Patient verbalizes understanding and has no questions at discharge.  If she has any concerns to please contact the office. Patient due for lab work and physical, recommend low dose CT for lung cancer screening.   Orders Placed This Encounter  Procedures  . Hgb A1c w/o eAG    Standing Status:   Future    Standing Expiration Date:   02/21/2020  . CMP12+LP+TP+TSH+6AC+CBC/D/Plt    Standing Status:   Future    Standing Expiration Date:   02/21/2020  . VITAMIN D 25 Hydroxy (Vit-D Deficiency, Fractures)    Standing Status:   Future    Standing Expiration Date:   02/21/2020  . Visual acuity screening    Standing Status:   Future    Standing Expiration Date:   02/21/2020  . POCT urinalysis dipstick    Standing Status:   Future    Standing Expiration Date:   02/21/2020  . EKG  12-Lead

## 2019-02-22 ENCOUNTER — Other Ambulatory Visit: Payer: Self-pay | Admitting: Medical

## 2019-02-22 DIAGNOSIS — Z1159 Encounter for screening for other viral diseases: Secondary | ICD-10-CM

## 2019-02-22 NOTE — Progress Notes (Unsigned)
Check on Flu vaccination. Orders entered for physical.

## 2019-02-23 ENCOUNTER — Other Ambulatory Visit: Payer: Self-pay | Admitting: Medical

## 2019-02-23 DIAGNOSIS — Z0000 Encounter for general adult medical examination without abnormal findings: Secondary | ICD-10-CM

## 2019-03-22 ENCOUNTER — Other Ambulatory Visit: Payer: Self-pay | Admitting: Medical

## 2019-03-22 DIAGNOSIS — Z1231 Encounter for screening mammogram for malignant neoplasm of breast: Secondary | ICD-10-CM

## 2019-03-24 ENCOUNTER — Ambulatory Visit
Admission: RE | Admit: 2019-03-24 | Discharge: 2019-03-24 | Disposition: A | Discharge status: Home / Self Care | Payer: BC Managed Care – PPO | Source: Ambulatory Visit | Attending: Medical | Admitting: Medical

## 2019-03-24 DIAGNOSIS — Z1231 Encounter for screening mammogram for malignant neoplasm of breast: Secondary | ICD-10-CM | POA: Insufficient documentation

## 2019-03-29 ENCOUNTER — Other Ambulatory Visit: Payer: Self-pay

## 2019-03-29 ENCOUNTER — Ambulatory Visit: Payer: Self-pay

## 2019-03-29 DIAGNOSIS — Z23 Encounter for immunization: Secondary | ICD-10-CM

## 2019-03-31 ENCOUNTER — Other Ambulatory Visit: Payer: Self-pay

## 2019-03-31 DIAGNOSIS — Z0000 Encounter for general adult medical examination without abnormal findings: Secondary | ICD-10-CM

## 2019-03-31 DIAGNOSIS — Z1159 Encounter for screening for other viral diseases: Secondary | ICD-10-CM

## 2019-03-31 DIAGNOSIS — Z114 Encounter for screening for human immunodeficiency virus [HIV]: Secondary | ICD-10-CM

## 2019-03-31 LAB — POCT URINALYSIS DIPSTICK
Bilirubin, UA: NEGATIVE
Blood, UA: NEGATIVE
Glucose, UA: NEGATIVE
Ketones, UA: NEGATIVE
Leukocytes, UA: NEGATIVE
Nitrite, UA: NEGATIVE
Protein, UA: NEGATIVE
Spec Grav, UA: 1.015 (ref 1.010–1.025)
Urobilinogen, UA: 0.2 E.U./dL
pH, UA: 5 (ref 5.0–8.0)

## 2019-04-01 LAB — CMP12+LP+TP+TSH+6AC+CBC/D/PLT
ALT: 14 IU/L (ref 0–32)
AST: 16 IU/L (ref 0–40)
Albumin/Globulin Ratio: 1.6 (ref 1.2–2.2)
Albumin: 4.1 g/dL (ref 3.8–4.8)
Alkaline Phosphatase: 95 IU/L (ref 39–117)
BUN/Creatinine Ratio: 17 (ref 12–28)
BUN: 12 mg/dL (ref 8–27)
Basophils Absolute: 0.1 10*3/uL (ref 0.0–0.2)
Basos: 1 %
Bilirubin Total: 0.3 mg/dL (ref 0.0–1.2)
Calcium: 9.3 mg/dL (ref 8.7–10.3)
Chloride: 105 mmol/L (ref 96–106)
Chol/HDL Ratio: 3.6 ratio (ref 0.0–4.4)
Cholesterol, Total: 190 mg/dL (ref 100–199)
Creatinine, Ser: 0.71 mg/dL (ref 0.57–1.00)
EOS (ABSOLUTE): 0.1 10*3/uL (ref 0.0–0.4)
Eos: 1 %
Estimated CHD Risk: 0.6 times avg. (ref 0.0–1.0)
Free Thyroxine Index: 5.8 — ABNORMAL HIGH (ref 1.2–4.9)
GFR calc Af Amer: 105 mL/min/{1.73_m2} (ref >59)
GFR calc non Af Amer: 91 mL/min/{1.73_m2} (ref >59)
GGT: 15 IU/L (ref 0–60)
Globulin, Total: 2.5 g/dL (ref 1.5–4.5)
Glucose: 87 mg/dL (ref 65–99)
HDL: 53 mg/dL (ref >39)
Hematocrit: 44.4 % (ref 34.0–46.6)
Hemoglobin: 15 g/dL (ref 11.1–15.9)
Immature Grans (Abs): 0 10*3/uL (ref 0.0–0.1)
Immature Granulocytes: 0 %
Iron: 69 ug/dL (ref 27–139)
LDH: 267 IU/L — ABNORMAL HIGH (ref 119–226)
LDL Chol Calc (NIH): 125 mg/dL — ABNORMAL HIGH (ref 0–99)
Lymphocytes Absolute: 2.7 10*3/uL (ref 0.7–3.1)
Lymphs: 40 %
MCH: 28.2 pg (ref 26.6–33.0)
MCHC: 33.8 g/dL (ref 31.5–35.7)
MCV: 84 fL (ref 79–97)
Monocytes Absolute: 0.4 10*3/uL (ref 0.1–0.9)
Monocytes: 7 %
Neutrophils Absolute: 3.5 10*3/uL (ref 1.4–7.0)
Neutrophils: 51 %
Phosphorus: 4.3 mg/dL (ref 3.0–4.3)
Platelets: 203 10*3/uL (ref 150–450)
Potassium: 4.2 mmol/L (ref 3.5–5.2)
RBC: 5.31 x10E6/uL — ABNORMAL HIGH (ref 3.77–5.28)
RDW: 14.4 % (ref 11.7–15.4)
Sodium: 139 mmol/L (ref 134–144)
T3 Uptake Ratio: 40 % — ABNORMAL HIGH (ref 24–39)
T4, Total: 14.6 ug/dL — ABNORMAL HIGH (ref 4.5–12.0)
TSH: 0.482 u[IU]/mL (ref 0.450–4.500)
Total Protein: 6.6 g/dL (ref 6.0–8.5)
Triglycerides: 67 mg/dL (ref 0–149)
Uric Acid: 5.1 mg/dL (ref 2.5–7.1)
VLDL Cholesterol Cal: 12 mg/dL (ref 5–40)
WBC: 6.7 10*3/uL (ref 3.4–10.8)

## 2019-04-01 LAB — URINALYSIS, ROUTINE W REFLEX MICROSCOPIC
Bilirubin, UA: NEGATIVE
Glucose, UA: NEGATIVE
Ketones, UA: NEGATIVE
Leukocytes,UA: NEGATIVE
Nitrite, UA: NEGATIVE
Protein,UA: NEGATIVE
RBC, UA: NEGATIVE
Specific Gravity, UA: 1.025 (ref 1.005–1.030)
Urobilinogen, Ur: 1 mg/dL (ref 0.2–1.0)
pH, UA: 5 (ref 5.0–7.5)

## 2019-04-01 LAB — HEPATITIS C ANTIBODY (REFLEX): HCV Ab: <0.1 s/co ratio (ref 0.0–0.9)

## 2019-04-01 LAB — HCV COMMENT:

## 2019-04-01 LAB — VITAMIN D 25 HYDROXY (VIT D DEFICIENCY, FRACTURES): Vit D, 25-Hydroxy: 18.9 ng/mL — ABNORMAL LOW (ref 30.0–100.0)

## 2019-04-01 LAB — HIV ANTIBODY (ROUTINE TESTING W REFLEX): HIV Screen 4th Generation wRfx: NONREACTIVE

## 2019-04-01 LAB — HGB A1C W/O EAG: Hgb A1c MFr Bld: 4.6 % — ABNORMAL LOW (ref 4.8–5.6)

## 2019-04-18 ENCOUNTER — Encounter: Payer: Self-pay | Admitting: Medical

## 2019-04-18 ENCOUNTER — Ambulatory Visit: Payer: Self-pay | Admitting: Medical

## 2019-04-18 ENCOUNTER — Other Ambulatory Visit: Payer: Self-pay

## 2019-04-18 VITALS — BP 142/77 | HR 88 | Temp 97.8°F | Resp 18 | Ht 67.0 in | Wt 167.8 lb

## 2019-04-18 DIAGNOSIS — Z1211 Encounter for screening for malignant neoplasm of colon: Secondary | ICD-10-CM

## 2019-04-18 DIAGNOSIS — Z716 Tobacco abuse counseling: Secondary | ICD-10-CM

## 2019-04-18 DIAGNOSIS — F17200 Nicotine dependence, unspecified, uncomplicated: Secondary | ICD-10-CM

## 2019-04-18 DIAGNOSIS — Z122 Encounter for screening for malignant neoplasm of respiratory organs: Secondary | ICD-10-CM

## 2019-04-18 DIAGNOSIS — Z0000 Encounter for general adult medical examination without abnormal findings: Secondary | ICD-10-CM

## 2019-04-18 MED ORDER — CHANTIX STARTING MONTH PAK 0.5 MG X 11 & 1 MG X 42 PO TABS: ORAL_TABLET | ORAL | 0 refills | Status: DC

## 2019-04-18 NOTE — Progress Notes (Signed)
Subjective:    Patient ID: Cindy Yang, female    DOB: [birth year], 63 y.o.   MRN: [id number]  HPI   63 yo female in non acute distress, here for annual physical exam. 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 Lives at home with husband , has  2 sons,  18yo and 12 yo.  smoker 1 ppd, smoked just before appointment.  Blood pressure (!) 142/77, pulse 88, temperature 97.8 F (36.6 C), temperature source Tympanic, resp. rate 18, height 5\' 7"  (1.702 m), weight 167 lb 12.8 oz (76.1 kg), SpO2 98 %. 7lb weight gain since Feb 2020.   No Known Allergies   Current Outpatient Medications:  .  varenicline (CHANTIX STARTING MONTH PAK) 0.5 MG X 11 & 1 MG X 42 tablet, Take one 0.5 mg tablet by mouth once daily for 3 days, then increase to one 0.5 mg tablet twice daily for 4 days, then increase to one 1 mg tablet twice daily., Disp: 53 tablet, Rfl: 0  Past Medical History:  Diagnosis Date  . Dyspnea   . History of prediabetes 10/18/2015  . Shoulder tendonitis, left 10/07/2016   Past Surgical History:  Procedure Laterality Date  . APPENDECTOMY    . 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 TUBAL LIGATION     Social History   Socioeconomic History  . Marital status: Married    Spouse name: Not on file  . Number of children: Not on file  . Years of education: Not on file  . Highest education level: Not on file  Occupational History  . Not on file  Social Needs  . Financial resource strain: Not on file  . Food insecurity    Worry: Not on file    Inability: Not on file  . Transportation needs    Medical: Not on file    Non-medical: Not on file  Tobacco Use  . Smoking status: Heavy Tobacco Smoker    Packs/day: 1.00    Years: 40.00    Pack years: 40.00    Types: Cigarettes    Start date: 09/25/1973  . Smokeless tobacco:  Never Used  Substance and Sexual Activity  . Alcohol use: No  . Drug use: No  . Sexual activity: Yes  Lifestyle  . Physical activity    Days per week: Not on file    Minutes per session: Not on file  . Stress: Not on file  Relationships  . Social Herbalist on phone: Not on file    Gets together: Not on file    Attends religious service: Not on file    Active member of club or organization: Not on file    Attends meetings of clubs or organizations: Not on file    Relationship status: Not on file  . Intimate partner violence    Fear of current or ex partner: Not on file    Emotionally abused: Not on file    Physically abused: Not on file    Forced sexual activity: Not on file  Other Topics Concern  . Not on file  Social History Narrative  . Not on file   smoker 1 ppd, no etoh use , no recreational drug use.   Review of Systems  Constitutional: Negative.   HENT: Positive for ear pain (right on/off ).  Eyes: Negative.   Respiratory: Negative.   Cardiovascular: Negative.   Gastrointestinal: Negative.   Endocrine: Positive for heat intolerance (hot flashes at night sleeps with a fan).  Genitourinary: Negative.   Musculoskeletal: Negative.        Left shoulder with history of bursitis, she states it still bothers her on/off, reviewed with patient to come see me if it is painful to move reviewed frozen shoulder with her.  Skin: Negative.   Allergic/Immunologic: Negative.   Neurological: Negative.   Hematological: Negative.   Psychiatric/Behavioral: Positive for sleep disturbance.  All other systems reviewed and are negative.   Goes to bed around 7pm , Wakes about 2:30 am -3am works by 6 am.    Visual acuity 20/50 R 20/50 L 20/40 bilateral Recent Results (from the past 2160 hour(s))  VITAMIN D 25 Hydroxy (Vit-D Deficiency, Fractures)     Status: Abnormal   Collection Time: 03/31/19  7:47 AM  Result Value Ref Range   Vit D, 25-Hydroxy 18.9 (L) 30.0 -  100.0 ng/mL    Comment: Vitamin D deficiency has been defined by the [place] practice guideline as a level of serum 25-OH vitamin D less than 20 ng/mL (1,2). The Endocrine Society went on to further define vitamin D insufficiency as a level between 21 and 29 ng/mL (2). 1. IOM (Institute of Medicine). 2010. Dietary reference    intakes for calcium and D. [place]: The    [employer]. 2. Holick MF, Binkley [place], Bischoff-Ferrari HA, et al.    Evaluation, treatment, and prevention of vitamin D    deficiency: an Endocrine Society clinical practice    guideline. JCEM. 2011 Jul; 96(7):1911-30.   Urinalysis, Routine w reflex microscopic     Status: None   Collection Time: 03/31/19  7:47 AM  Result Value Ref Range   Specific Gravity, UA 1.025 1.005 - 1.030   pH, UA 5.0 5.0 - 7.5   Color, UA Yellow Yellow   Appearance Ur Clear Clear   Leukocytes,UA Negative Negative   Protein,UA Negative Negative/Trace   Glucose, UA Negative Negative   Ketones, UA Negative Negative   RBC, UA Negative Negative   Bilirubin, UA Negative Negative   Urobilinogen, Ur 1.0 0.2 - 1.0 mg/dL   Nitrite, UA Negative Negative   Microscopic Examination Comment     Comment: Microscopic not indicated and not performed.  CMP12+LP+TP+TSH+6AC+CBC/D/Plt     Status: Abnormal   Collection Time: 03/31/19  7:47 AM  Result Value Ref Range   Glucose 87 65 - 99 mg/dL   Uric Acid 5.1 2.5 - 7.1 mg/dL    Comment:            Therapeutic target for gout patients: <6.0   BUN 12 8 - 27 mg/dL   Creatinine, Ser 0.71 0.57 - 1.00 mg/dL   GFR calc non Af Amer 91 >59 mL/min/1.73   GFR calc Af Amer 105 >59 mL/min/1.73   BUN/Creatinine Ratio 17 12 - 28   Sodium 139 134 - 144 mmol/L   Potassium 4.2 3.5 - 5.2 mmol/L   Chloride 105 96 - 106 mmol/L   Calcium 9.3 8.7 - 10.3 mg/dL   Phosphorus 4.3 3.0 - 4.3 mg/dL   Total Protein 6.6 6.0 - 8.5 g/dL   Albumin 4.1 3.8 - 4.8 g/dL   Globulin,  Total 2.5 1.5 - 4.5 g/dL   Albumin/Globulin Ratio 1.6 1.2 - 2.2   Bilirubin Total 0.3 0.0 - 1.2 mg/dL  Alkaline Phosphatase 95 39 - 117 IU/L   LDH 267 (H) 119 - 226 IU/L   AST 16 0 - 40 IU/L   ALT 14 0 - 32 IU/L   GGT 15 0 - 60 IU/L   Iron 69 27 - 139 ug/dL   Cholesterol, Total 190 100 - 199 mg/dL   Triglycerides 67 0 - 149 mg/dL   HDL 53 >39 mg/dL   VLDL Cholesterol Cal 12 5 - 40 mg/dL   LDL Chol Calc (NIH) 125 (H) 0 - 99 mg/dL   Chol/HDL Ratio 3.6 0.0 - 4.4 ratio    Comment:                                   T. Chol/HDL Ratio                                             Men  Women                               1/2 Avg.Risk  3.4    3.3                                   Avg.Risk  5.0    4.4                                2X Avg.Risk  9.6    7.1                                3X Avg.Risk 23.4   11.0    Estimated CHD Risk 0.6 0.0 - 1.0 times avg.    Comment: The CHD Risk is based on the T. Chol/HDL ratio. Other factors affect CHD Risk such as hypertension, smoking, diabetes, severe obesity, and family history of premature CHD.    TSH 0.482 0.450 - 4.500 uIU/mL   T4, Total 14.6 (H) 4.5 - 12.0 ug/dL   T3 Uptake Ratio 40 (H) 24 - 39 %   Free Thyroxine Index 5.8 (H) 1.2 - 4.9   WBC 6.7 3.4 - 10.8 x10E3/uL   RBC 5.31 (H) 3.77 - 5.28 x10E6/uL   Hemoglobin 15.0 11.1 - 15.9 g/dL   Hematocrit 44.4 34.0 - 46.6 %   MCV 84 79 - 97 fL   MCH 28.2 26.6 - 33.0 pg   MCHC 33.8 31.5 - 35.7 g/dL   RDW 14.4 11.7 - 15.4 %   Platelets 203 150 - 450 x10E3/uL   Neutrophils 51 Not Estab. %   Lymphs 40 Not Estab. %   Monocytes 7 Not Estab. %   Eos 1 Not Estab. %   Basos 1 Not Estab. %   Neutrophils Absolute 3.5 1.4 - 7.0 x10E3/uL   Lymphocytes Absolute 2.7 0.7 - 3.1 x10E3/uL   Monocytes Absolute 0.4 0.1 - 0.9 x10E3/uL   EOS (ABSOLUTE) 0.1 0.0 - 0.4 x10E3/uL   Basophils Absolute 0.1 0.0 - 0.2 x10E3/uL   Immature Granulocytes 0 Not Estab. %   Immature Grans (Abs) 0.0 0.0 - 0.1 x10E3/uL  Hgb A1c  w/o eAG     Status: Abnormal   Collection Time: 03/31/19  7:47 AM  Result Value Ref Range   Hgb A1c MFr Bld 4.6 (L) 4.8 - 5.6 %    Comment:          Prediabetes: 5.7 - 6.4          Diabetes: >6.4          Glycemic control for adults with diabetes: <7.0   Hepatitis c antibody (reflex)     Status: None   Collection Time: 03/31/19  7:47 AM  Result Value Ref Range   HCV Ab <0.1 0.0 - 0.9 s/co ratio  HIV Antibody (routine testing w rflx)     Status: None   Collection Time: 03/31/19  7:47 AM  Result Value Ref Range   HIV Screen 4th Generation wRfx Non Reactive Non Reactive  HCV Comment:     Status: None   Collection Time: 03/31/19  7:47 AM  Result Value Ref Range   Comment: Comment     Comment: Non reactive HCV antibody screen is consistent with no HCV infection, unless recent infection is suspected or other evidence exists to indicate HCV infection.   POCT urinalysis dipstick     Status: Normal   Collection Time: 03/31/19  8:11 AM  Result Value Ref Range   Color, UA     Clarity, UA     Glucose, UA Negative Negative   Bilirubin, UA negative    Ketones, UA negative    Spec Grav, UA 1.015 1.010 - 1.025   Blood, UA negative    pH, UA 5.0 5.0 - 8.0   Protein, UA Negative Negative   Urobilinogen, UA 0.2 0.2 or 1.0 E.U./dL   Nitrite, UA negative    Leukocytes, UA Negative Negative   Appearance     Odor        Objective:   Physical Exam Vitals signs and nursing note reviewed. Exam conducted with a chaperone present.  Constitutional:      Appearance: Normal appearance. She is obese.  HENT:     Head: Normocephalic and atraumatic.     Jaw: There is normal jaw occlusion.     Right Ear: Ear canal and external ear normal. A middle ear effusion is present.     Left Ear: Tympanic membrane, ear canal and external ear normal.     Nose: Mucosal edema and congestion (right side) present.     Left Turbinates: Enlarged. Not pale.     Mouth/Throat:     Lips: Pink.     Mouth: Mucous  membranes are moist.     Pharynx: Oropharynx is clear. Uvula midline.     Tonsils: 2+ on the right. 2+ on the left.  Eyes:     General: Lids are normal.     Extraocular Movements: Extraocular movements intact.     Conjunctiva/sclera: Conjunctivae normal.     Pupils: Pupils are equal, round, and reactive to light.  Neck:     Musculoskeletal: Normal range of motion and neck supple.     Thyroid: No thyroid mass, thyromegaly or thyroid tenderness.     Trachea: Trachea normal.  Cardiovascular:     Rate and Rhythm: Normal rate and regular rhythm.     Pulses: Normal pulses.          Radial pulses are 2+ on the right side and 2+ on the left side.       Dorsalis pedis pulses are 2+  on the right side and 2+ on the left side.       Posterior tibial pulses are 2+ on the right side and 2+ on the left side.     Heart sounds: Normal heart sounds.  Pulmonary:     Effort: Pulmonary effort is normal.     Breath sounds: Normal breath sounds and air entry.  Abdominal:     General: Abdomen is flat. Bowel sounds are normal.     Palpations: Abdomen is soft. There is no hepatomegaly, splenomegaly, mass or pulsatile mass.     Tenderness: There is no right CVA tenderness or left CVA tenderness.  Genitourinary:    Rectum: Guaiac result negative. External hemorrhoid present. No mass, tenderness, anal fissure or internal hemorrhoid. Normal anal tone.  Musculoskeletal: Normal range of motion.     Right lower leg: No edema.     Left lower leg: No edema.  Feet:     Right foot:     Skin integrity: Skin integrity normal.     Left foot:     Skin integrity: Skin integrity normal.  Lymphadenopathy:     Head:     Right side of head: No submental, submandibular, tonsillar, preauricular, posterior auricular or occipital adenopathy.     Left side of head: No submental, submandibular, tonsillar, preauricular, posterior auricular or occipital adenopathy.     Cervical: No cervical adenopathy.     Right cervical: No  superficial, deep or posterior cervical adenopathy.    Left cervical: No superficial, deep or posterior cervical adenopathy.     Upper Body:     Right upper body: No supraclavicular or epitrochlear adenopathy.     Left upper body: No supraclavicular or epitrochlear adenopathy.  Skin:    General: Skin is warm and dry.     Capillary Refill: Capillary refill takes less than 2 seconds.     Nails: There is clubbing.  Neurological:     General: No focal deficit present.     Mental Status: She is alert and oriented to person, place, and time.     GCS: GCS eye subscore is 4. GCS verbal subscore is 5. GCS motor subscore is 6.     Cranial Nerves: Cranial nerves are intact.     Sensory: Sensation is intact.     Motor: Motor function is intact.     Coordination: Coordination is intact. Romberg sign negative. Coordination normal. Finger-Nose-Finger Test and Heel to [facility] Test normal.     Gait: Gait is intact.     Deep Tendon Reflexes:     Reflex Scores:      Brachioradialis reflexes are 2+ on the right side and 2+ on the left side.      Patellar reflexes are 2+ on the right side and 2+ on the left side.      Achilles reflexes are 2+ on the right side and 2+ on the left side. Psychiatric:        Attention and Perception: Attention and perception normal.        Mood and Affect: Mood and affect normal.        Speech: Speech normal.        Behavior: Behavior normal. Behavior is cooperative.        Thought Content: Thought content normal.        Cognition and Memory: Cognition and memory normal.        Judgment: Judgment normal.    Recent Results (from the past 2160 hour(s))  VITAMIN  D 25 Hydroxy (Vit-D Deficiency, Fractures)     Status: Abnormal   Collection Time: 03/31/19  7:47 AM  Result Value Ref Range   Vit D, 25-Hydroxy 18.9 (L) 30.0 - 100.0 ng/mL    Comment: Vitamin D deficiency has been defined by the [place] practice guideline as a level of serum  25-OH vitamin D less than 20 ng/mL (1,2). The Endocrine Society went on to further define vitamin D insufficiency as a level between 21 and 29 ng/mL (2). 1. IOM (Institute of Medicine). 2010. Dietary reference    intakes for calcium and D. [place]: The    [employer]. 2. Holick MF, Binkley [place], Bischoff-Ferrari HA, et al.    Evaluation, treatment, and prevention of vitamin D    deficiency: an Endocrine Society clinical practice    guideline. JCEM. 2011 Jul; 96(7):1911-30.   Urinalysis, Routine w reflex microscopic     Status: None   Collection Time: 03/31/19  7:47 AM  Result Value Ref Range   Specific Gravity, UA 1.025 1.005 - 1.030   pH, UA 5.0 5.0 - 7.5   Color, UA Yellow Yellow   Appearance Ur Clear Clear   Leukocytes,UA Negative Negative   Protein,UA Negative Negative/Trace   Glucose, UA Negative Negative   Ketones, UA Negative Negative   RBC, UA Negative Negative   Bilirubin, UA Negative Negative   Urobilinogen, Ur 1.0 0.2 - 1.0 mg/dL   Nitrite, UA Negative Negative   Microscopic Examination Comment     Comment: Microscopic not indicated and not performed.  CMP12+LP+TP+TSH+6AC+CBC/D/Plt     Status: Abnormal   Collection Time: 03/31/19  7:47 AM  Result Value Ref Range   Glucose 87 65 - 99 mg/dL   Uric Acid 5.1 2.5 - 7.1 mg/dL    Comment:            Therapeutic target for gout patients: <6.0   BUN 12 8 - 27 mg/dL   Creatinine, Ser 0.71 0.57 - 1.00 mg/dL   GFR calc non Af Amer 91 >59 mL/min/1.73   GFR calc Af Amer 105 >59 mL/min/1.73   BUN/Creatinine Ratio 17 12 - 28   Sodium 139 134 - 144 mmol/L   Potassium 4.2 3.5 - 5.2 mmol/L   Chloride 105 96 - 106 mmol/L   Calcium 9.3 8.7 - 10.3 mg/dL   Phosphorus 4.3 3.0 - 4.3 mg/dL   Total Protein 6.6 6.0 - 8.5 g/dL   Albumin 4.1 3.8 - 4.8 g/dL   Globulin, Total 2.5 1.5 - 4.5 g/dL   Albumin/Globulin Ratio 1.6 1.2 - 2.2   Bilirubin Total 0.3 0.0 - 1.2 mg/dL   Alkaline Phosphatase 95 39 - 117 IU/L   LDH 267  (H) 119 - 226 IU/L   AST 16 0 - 40 IU/L   ALT 14 0 - 32 IU/L   GGT 15 0 - 60 IU/L   Iron 69 27 - 139 ug/dL   Cholesterol, Total 190 100 - 199 mg/dL   Triglycerides 67 0 - 149 mg/dL   HDL 53 >39 mg/dL   VLDL Cholesterol Cal 12 5 - 40 mg/dL   LDL Chol Calc (NIH) 125 (H) 0 - 99 mg/dL   Chol/HDL Ratio 3.6 0.0 - 4.4 ratio    Comment:  T. Chol/HDL Ratio                                             Men  Women                               1/2 Avg.Risk  3.4    3.3                                   Avg.Risk  5.0    4.4                                2X Avg.Risk  9.6    7.1                                3X Avg.Risk 23.4   11.0    Estimated CHD Risk 0.6 0.0 - 1.0 times avg.    Comment: The CHD Risk is based on the T. Chol/HDL ratio. Other factors affect CHD Risk such as hypertension, smoking, diabetes, severe obesity, and family history of premature CHD.    TSH 0.482 0.450 - 4.500 uIU/mL   T4, Total 14.6 (H) 4.5 - 12.0 ug/dL   T3 Uptake Ratio 40 (H) 24 - 39 %   Free Thyroxine Index 5.8 (H) 1.2 - 4.9   WBC 6.7 3.4 - 10.8 x10E3/uL   RBC 5.31 (H) 3.77 - 5.28 x10E6/uL   Hemoglobin 15.0 11.1 - 15.9 g/dL   Hematocrit 44.4 34.0 - 46.6 %   MCV 84 79 - 97 fL   MCH 28.2 26.6 - 33.0 pg   MCHC 33.8 31.5 - 35.7 g/dL   RDW 14.4 11.7 - 15.4 %   Platelets 203 150 - 450 x10E3/uL   Neutrophils 51 Not Estab. %   Lymphs 40 Not Estab. %   Monocytes 7 Not Estab. %   Eos 1 Not Estab. %   Basos 1 Not Estab. %   Neutrophils Absolute 3.5 1.4 - 7.0 x10E3/uL   Lymphocytes Absolute 2.7 0.7 - 3.1 x10E3/uL   Monocytes Absolute 0.4 0.1 - 0.9 x10E3/uL   EOS (ABSOLUTE) 0.1 0.0 - 0.4 x10E3/uL   Basophils Absolute 0.1 0.0 - 0.2 x10E3/uL   Immature Granulocytes 0 Not Estab. %   Immature Grans (Abs) 0.0 0.0 - 0.1 x10E3/uL  Hgb A1c w/o eAG     Status: Abnormal   Collection Time: 03/31/19  7:47 AM  Result Value Ref Range   Hgb A1c MFr Bld 4.6 (L) 4.8 - 5.6 %    Comment:           Prediabetes: 5.7 - 6.4          Diabetes: >6.4          Glycemic control for adults with diabetes: <7.0   Hepatitis c antibody (reflex)     Status: None   Collection Time: 03/31/19  7:47 AM  Result Value Ref Range   HCV Ab <0.1 0.0 - 0.9 s/co ratio  HIV Antibody (routine testing w rflx)     Status: None   Collection Time: 03/31/19  7:47 AM  Result Value  Ref Range   HIV Screen 4th Generation wRfx Non Reactive Non Reactive  HCV Comment:     Status: None   Collection Time: 03/31/19  7:47 AM  Result Value Ref Range   Comment: Comment     Comment: Non reactive HCV antibody screen is consistent with no HCV infection, unless recent infection is suspected or other evidence exists to indicate HCV infection.   POCT urinalysis dipstick     Status: Normal   Collection Time: 03/31/19  8:11 AM  Result Value Ref Range   Color, UA     Clarity, UA     Glucose, UA Negative Negative   Bilirubin, UA negative    Ketones, UA negative    Spec Grav, UA 1.015 1.010 - 1.025   Blood, UA negative    pH, UA 5.0 5.0 - 8.0   Protein, UA Negative Negative   Urobilinogen, UA 0.2 0.2 or 1.0 E.U./dL   Nitrite, UA negative    Leukocytes, UA Negative Negative   Appearance     Odor     MAEW 5/5 upper and lower extremity strength bilaterally      Assessment & Plan:  Annual physical exam / reviewed labs Vit D defieincy Start Vit  D3 4000IU/day. Eustachian tube dysfunction  Right ear try OTC Zyrtec daily to see if that helps, to call the office if ear pain becomes constant. Return in 3 months for vit D level and Lipid panel. And TSH Smoker,Smoking cessation, patient aware of smoking causing COPD, Lung Cancer in both parents. smoking cessation discussed with  Patient, she is  willing to try Chantix to see if she can quit.Reviewed vivid dreams are possible with patient if these occur stop medication and call the office.. Will also schedule Low dose lung CT and Screening and  Colonoscopy ordered. Blood  pressure elevated without hypertension recheck 3 months.. Recommended next visit no smoking 1.5 hours before appointment and to cut down on salt shaker but can salt cooking food. In need of papsmear. Will give patient [facility] number. Recheck in 2 weeks on Chantix. Patient verbalizes understanding and has no questions at discharge.    Computer down at the beginning of appointment.

## 2019-04-18 NOTE — Patient Instructions (Signed)
Preventing High Cholesterol °Cholesterol is a white, waxy substance similar to fat that the human body needs to help build cells. The liver makes all the cholesterol that a person's body needs. Having high cholesterol (hypercholesterolemia) increases a person's risk for heart disease and stroke. Extra (excess) cholesterol comes from the food the person eats. °High cholesterol can often be prevented with diet and lifestyle changes. If you already have high cholesterol, you can control it with diet and lifestyle changes and with medicine. °How can high cholesterol affect me? °If you have high cholesterol, deposits (plaques) may build up on the walls of your arteries. The arteries are the blood vessels that carry blood away from your heart. °Plaques make the arteries narrower and stiffer. This can limit or block blood flow and cause blood clots to form. Blood clots: °· Are tiny balls of cells that form in your blood. °· Can move to the heart or brain, causing a heart attack or stroke. °Plaques in arteries greatly increase your risk for heart attack and stroke.Making diet and lifestyle changes can reduce your risk for these conditions that may threaten your life. °What can increase my risk? °This condition is more likely to develop in people who: °· Eat foods that are high in saturated fat or cholesterol. Saturated fat is mostly found in: °? Foods that contain animal fat, such as red meat and some dairy products. °? Certain fatty foods made from plants, such as tropical oils. °· Are overweight. °· Are not getting enough exercise. °· Have a family history of high cholesterol. °What actions can I take to prevent this? °Nutrition ° °· Eat less saturated fat. °· Avoid trans fats (partially hydrogenated oils). These are often found in margarine and in some baked goods, fried foods, and snacks bought in packages. °· Avoid precooked or cured meat, such as sausages or meat loaves. °· Avoid foods and drinks that have added  sugars. °· Eat more fruits, vegetables, and whole grains. °· Choose healthy sources of protein, such as fish, poultry, lean cuts of red meat, beans, peas, lentils, and nuts. °· Choose healthy sources of fat, such as: °? Nuts. °? Vegetable oils, especially olive oil. °? Fish that have healthy fats (omega-3 fatty acids), such as mackerel or salmon. °The items listed above may not be a complete list of recommended foods and beverages. Contact a dietitian for more information. °Lifestyle °· Lose weight if you are overweight. Losing 5-10 lb (2.3-4.5 kg) can help prevent or control high cholesterol. It can also lower your risk for diabetes and high blood pressure. Ask your health care provider to help you with a diet and exercise plan to lose weight safely. °· Do not use any products that contain nicotine or tobacco, such as cigarettes, e-cigarettes, and chewing tobacco. If you need help quitting, ask your health care provider. °· Limit your alcohol intake. °? Do not drink alcohol if: °§ Your health care provider tells you not to drink. °§ You are pregnant, may be pregnant, or are planning to become pregnant. °? If you drink alcohol: °§ Limit how much you use to: °§ 0-1 drink a day for women. °§ 0-2 drinks a day for men. °§ Be aware of how much alcohol is in your drink. In the [place] one drink equals one 12 oz bottle of beer (355 mL), one 5 oz glass of wine (148 mL), or one 1½ oz glass of hard liquor (44 mL). °Activity ° °· Get enough exercise. Each week, do at   least 150 minutes of exercise that takes a medium level of effort (moderate-intensity exercise). ? This is exercise that:  Makes your heart beat faster and makes you breathe harder than usual.  Allows you to still be able to talk. ? You could exercise in short sessions several times a day or longer sessions a few times a week. For example, on 5 days each week, you could walk fast or ride your bike 3 times a day for 10 minutes each time.  Do exercises as told  by your health care provider. Medicines  In addition to diet and lifestyle changes, your health care provider may recommend medicines to help lower cholesterol. This may be a medicine to lower the amount of cholesterol your liver makes. You may need medicine if: ? Diet and lifestyle changes do not lower your cholesterol enough. ? You have high cholesterol and other risk factors for heart disease or stroke.  Take over-the-counter and prescription medicines only as told by your health care provider. General information  Manage your risk factors for high cholesterol. Talk with your health care provider about all your risk factors and how to lower your risk.  Manage other conditions that you have, such as diabetes or high blood pressure (hypertension).  Have blood tests to check your cholesterol levels at regular points in time as told by your health care provider.  Keep all follow-up visits as told by your health care provider. This is important. Where to find more information  American Heart Association: www.heart.org  National Heart, Lung, and Blood Institute: https://wilson-eaton.com/ Summary  High cholesterol increases your risk for heart disease and stroke. By keeping your cholesterol level low, you can reduce your risk for these conditions.  High cholesterol can often be prevented with diet and lifestyle changes.  Work with your health care provider to manage your risk factors, and have your blood tested regularly. This information is not intended to replace advice given to you by your health care provider. Make sure you discuss any questions you have with your health care provider. Document Released: 06/17/2015 Document Revised: 09/24/2018 Document Reviewed: 02/09/2016 Elsevier Patient Education  [place]. Varenicline oral tablets What is this medicine? VARENICLINE (var e NI kleen) is used to help people quit smoking. It is used with a patient support program recommended by  your physician. This medicine may be used for other purposes; ask your health care provider or pharmacist if you have questions. COMMON BRAND NAME(S): Chantix What should I tell my health care provider before I take this medicine? They need to know if you have any of these conditions:  heart disease  if you often drink alcohol  kidney disease  mental illness  on hemodialysis  seizures  history of stroke  suicidal thoughts, plans, or attempt; a previous suicide attempt by you or a family member  an unusual or allergic reaction to varenicline, other medicines, foods, dyes, or preservatives  pregnant or trying to get pregnant  breast-feeding How should I use this medicine? Take this medicine by mouth after eating. Take with a full glass of water. Follow the directions on the prescription label. Take your doses at regular intervals. Do not take your medicine more often than directed. There are 3 ways you can use this medicine to help you quit smoking; talk to your health care professional to decide which plan is right for you: 1) you can choose a quit date and start this medicine 1 week before the quit date, or,  2) you can start taking this medicine before you choose a quit date, and then pick a quit date between day 8 and 35 days of treatment, or, 3) if you are not sure that you are able or willing to quit smoking right away, start taking this medicine and slowly decrease the amount you smoke as directed by your health care professional with the goal of being cigarette-free by week 12 of treatment. Stick to your plan; ask about support groups or other ways to help you remain cigarette-free. If you are motivated to quit smoking and did not succeed during a previous attempt with this medicine for reasons other than side effects, or if you returned to smoking after this treatment, speak with your health care professional about whether another course of this medicine may be right for  you. A special MedGuide will be given to you by the pharmacist with each prescription and refill. Be sure to read this information carefully each time. Talk to your pediatrician regarding the use of this medicine in children. This medicine is not approved for use in children. Overdosage: If you think you have taken too much of this medicine contact a poison control center or emergency room at once. NOTE: This medicine is only for you. Do not share this medicine with others. What if I miss a dose? If you miss a dose, take it as soon as you can. If it is almost time for your next dose, take only that dose. Do not take double or extra doses. What may interact with this medicine?  alcohol  insulin  other medicines used to help people quit smoking  theophylline  warfarin This list may not describe all possible interactions. Give your health care provider a list of all the medicines, herbs, non-prescription drugs, or dietary supplements you use. Also tell them if you smoke, drink alcohol, or use illegal drugs. Some items may interact with your medicine. What should I watch for while using this medicine? It is okay if you do not succeed at your attempt to quit and have a cigarette. You can still continue your quit attempt and keep using this medicine as directed. Just throw away your cigarettes and get back to your quit plan. Talk to your health care provider before using other treatments to quit smoking. Using this medicine with other treatments to quit smoking may increase the risk for side effects compared to using a treatment alone. You may get drowsy or dizzy. Do not drive, use machinery, or do anything that needs mental alertness until you know how this medicine affects you. Do not stand or sit up quickly, especially if you are an older patient. This reduces the risk of dizzy or fainting spells. Decrease the number of alcoholic beverages that you drink during treatment with this medicine until  you know if this medicine affects your ability to tolerate alcohol. Some people have experienced increased drunkenness (intoxication), unusual or sometimes aggressive behavior, or no memory of things that have happened (amnesia) during treatment with this medicine. Sleepwalking can happen during treatment with this medicine, and can sometimes lead to behavior that is harmful to you, other people, or property. Stop taking this medicine and tell your doctor if you start sleepwalking or have other unusual sleep-related activity. After taking this medicine, you may get up out of bed and do an activity that you do not know you are doing. The next morning, you may have no memory of this. Activities include driving a car ("sleep-driving"),  making and eating food, talking on the phone, sexual activity, and sleep-walking. Serious injuries have occurred. Stop the medicine and call your doctor right away if you find out you have done any of these activities. Do not take this medicine if you have used alcohol that evening. Do not take it if you have taken another medicine for sleep. The risk of doing these sleep-related activities is higher. Patients and their families should watch out for new or worsening depression or thoughts of suicide. Also watch out for sudden changes in feelings such as feeling anxious, agitated, panicky, irritable, hostile, aggressive, impulsive, severely restless, overly excited and hyperactive, or not being able to sleep. If this happens, call your health care professional. If you have diabetes and you quit smoking, the effects of insulin may be increased and you may need to reduce your insulin dose. Check with your doctor or health care professional about how you should adjust your insulin dose. What side effects may I notice from receiving this medicine? Side effects that you should report to your doctor or health care professional as soon as possible:  allergic reactions like skin rash,  itching or hives, swelling of the face, lips, tongue, or throat  acting aggressive, being angry or violent, or acting on dangerous impulses  breathing problems  changes in emotions or moods  chest pain or chest tightness  feeling faint or lightheaded, falls  hallucination, loss of contact with reality  mouth sores  redness, blistering, peeling or loosening of the skin, including inside the mouth  signs and symptoms of a stroke like changes in vision; confusion; trouble speaking or understanding; severe headaches; sudden numbness or weakness of the face, arm or leg; trouble walking; dizziness; loss of balance or coordination  seizures  sleepwalking  suicidal thoughts or other mood changes Side effects that usually do not require medical attention (report to your doctor or health care professional if they continue or are bothersome):  constipation  gas  headache  nausea, vomiting  strange dreams  trouble sleeping This list may not describe all possible side effects. Call your doctor for medical advice about side effects. You may report side effects to FDA at 1-800-FDA-1088. Where should I keep my medicine? Keep out of the reach of children. Store at room temperature between 15 and 30 degrees C (59 and 86 degrees F). Throw away any unused medicine after the expiration date. NOTE: This sheet is a summary. It may not cover all possible information. If you have questions about this medicine, talk to your doctor, pharmacist, or health care provider.  2020 Elsevier/Gold Standard (2018-05-21 14:27:36) Vitamin D Deficiency Vitamin D deficiency is when your body does not have enough vitamin D. Vitamin D is important to your body because:  It helps your body use other minerals.  It helps to keep your bones strong and healthy.  It may help to prevent some diseases.  It helps your heart and other muscles work well. Not getting enough vitamin D can make your bones soft. It can  also cause other health problems. What are the causes? This condition may be caused by:  Not eating enough foods that contain vitamin D.  Not getting enough sun.  Having diseases that make it hard for your body to absorb vitamin D.  Having a surgery in which a part of the stomach or a part of the small intestine is removed.  Having kidney disease or liver disease. What increases the risk? You are more likely to  get this condition if:  You are older.  You do not spend much time outdoors.  You live in a nursing home.  You have had broken bones.  You have weak or thin bones (osteoporosis).  You have a disease or condition that changes how your body absorbs vitamin D.  You have dark skin.  You take certain medicines.  You are overweight or obese. What are the signs or symptoms?  In mild cases, there may not be any symptoms. If the condition is very bad, symptoms may include: ? Bone pain. ? Muscle pain. ? Falling often. ? Broken bones caused by a minor injury. How is this treated? Treatment may include taking supplements as told by your doctor. Your doctor will tell you what dose is best for you. Supplements may include:  Vitamin D.  Calcium. Follow these instructions at home: Eating and drinking   Eat foods that contain vitamin D, such as: ? Dairy products, cereals, or juices with added vitamin D. Check the label. ? Fish, such as salmon or trout. ? Eggs. ? Oysters. ? Mushrooms. The items listed above may not be a complete list of what you can eat and drink. Contact a dietitian for more options. General instructions  Take medicines and supplements only as told by your doctor.  Get regular, safe exposure to natural sunlight.  Do not use a tanning bed.  Maintain a healthy weight. Lose weight if needed.  Keep all follow-up visits as told by your doctor. This is important. How is this prevented?  You can get vitamin D by: ? Eating foods that naturally  contain vitamin D. ? Eating or drinking products that have vitamin D added to them, such as cereals, juices, and milk. ? Taking vitamin D or a multivitamin that contains vitamin D. ? Being in the sun. Your body makes vitamin D when your skin is exposed to sunlight. Your body changes the sunlight into a form of the vitamin that it can use. Contact a doctor if:  Your symptoms do not go away.  You feel sick to your stomach (nauseous).  You throw up (vomit).  You poop less often than normal, or you have trouble pooping (constipation). Summary  Vitamin D deficiency is when your body does not have enough vitamin D.  Vitamin D helps to keep your bones strong and healthy.  This condition is often treated by taking a supplement.  Your doctor will tell you what dose is best for you. This information is not intended to replace advice given to you by your health care provider. Make sure you discuss any questions you have with your health care provider. Document Released: 05/22/2011 Document Revised: 02/08/2018 Document Reviewed: 02/08/2018 Elsevier Patient Education  2020 [employer].

## 2019-04-21 ENCOUNTER — Other Ambulatory Visit: Payer: Self-pay

## 2019-04-21 ENCOUNTER — Telehealth: Payer: Self-pay

## 2019-04-21 DIAGNOSIS — Z1211 Encounter for screening for malignant neoplasm of colon: Secondary | ICD-10-CM

## 2019-04-21 NOTE — Telephone Encounter (Signed)
Gastroenterology Pre-Procedure Review  Request Date: Monday 05/09/19  Requesting Physician: Dr. Marius Ditch  PATIENT REVIEW QUESTIONS: The patient responded to the following health history questions as indicated:    1. Are you having any GI issues? no 2. Do you have a personal history of Polyps? no 3. Do you have a family history of Colon Cancer or Polyps? no 4. Diabetes Mellitus? no 5. Joint replacements in the past 12 months?no 6. Major health problems in the past 3 months?no 7. Any artificial heart valves, MVP, or defibrillator?no    MEDICATIONS & ALLERGIES:    Patient reports the following regarding taking any anticoagulation/antiplatelet therapy:   Plavix, Coumadin, Eliquis, Xarelto, Lovenox, Pradaxa, Brilinta, or Effient? no Aspirin? no  Patient confirms/reports the following medications:  Current Outpatient Medications  Medication Sig Dispense Refill  . Cetirizine HCl (ZYRTEC ALLERGY PO) Take 10 mg by mouth daily.    . fluticasone (FLONASE) 50 MCG/ACT nasal spray Place 2 sprays into both nostrils daily.    . varenicline (CHANTIX STARTING MONTH PAK) 0.5 MG X 11 & 1 MG X 42 tablet Take one 0.5 mg tablet by mouth once daily for 3 days, then increase to one 0.5 mg tablet twice daily for 4 days, then increase to one 1 mg tablet twice daily. 53 tablet 0   No current facility-administered medications for this visit.     Patient confirms/reports the following allergies:  No Known Allergies  No orders of the defined types were placed in this encounter.   AUTHORIZATION INFORMATION Primary Insurance: 1D#: Group #:  Secondary Insurance: 1D#: Group #:  SCHEDULE INFORMATION: Date: Monday 05/09/19 Time: Location:[facility]

## 2019-05-05 ENCOUNTER — Other Ambulatory Visit
Admission: RE | Admit: 2019-05-05 | Discharge: 2019-05-05 | Disposition: A | Discharge status: Home / Self Care | Payer: BC Managed Care – PPO | Source: Ambulatory Visit | Attending: Gastroenterology | Admitting: Gastroenterology

## 2019-05-05 DIAGNOSIS — Z20828 Contact with and (suspected) exposure to other viral communicable diseases: Secondary | ICD-10-CM | POA: Diagnosis not present

## 2019-05-05 DIAGNOSIS — Z01812 Encounter for preprocedural laboratory examination: Secondary | ICD-10-CM | POA: Insufficient documentation

## 2019-05-05 LAB — SARS CORONAVIRUS 2 (TAT 6-24 HRS): SARS Coronavirus 2: NEGATIVE

## 2019-05-09 ENCOUNTER — Encounter
Admission: RE | Disposition: A | Discharge status: Home / Self Care | Payer: Self-pay | Source: Home / Self Care | Attending: Gastroenterology

## 2019-05-09 ENCOUNTER — Other Ambulatory Visit: Payer: Self-pay

## 2019-05-09 ENCOUNTER — Ambulatory Visit: Payer: BC Managed Care – PPO | Admitting: Anesthesiology

## 2019-05-09 ENCOUNTER — Ambulatory Visit
Admission: RE | Admit: 2019-05-09 | Discharge: 2019-05-09 | Disposition: A | Discharge status: Home / Self Care | Payer: BC Managed Care – PPO | Attending: Gastroenterology | Admitting: Gastroenterology

## 2019-05-09 DIAGNOSIS — Z1211 Encounter for screening for malignant neoplasm of colon: Secondary | ICD-10-CM | POA: Diagnosis not present

## 2019-05-09 DIAGNOSIS — F17210 Nicotine dependence, cigarettes, uncomplicated: Secondary | ICD-10-CM | POA: Diagnosis not present

## 2019-05-09 DIAGNOSIS — Z79899 Other long term (current) drug therapy: Secondary | ICD-10-CM | POA: Diagnosis not present

## 2019-05-09 DIAGNOSIS — D120 Benign neoplasm of cecum: Secondary | ICD-10-CM | POA: Insufficient documentation

## 2019-05-09 DIAGNOSIS — D122 Benign neoplasm of ascending colon: Secondary | ICD-10-CM | POA: Insufficient documentation

## 2019-05-09 DIAGNOSIS — K635 Polyp of colon: Secondary | ICD-10-CM

## 2019-05-09 DIAGNOSIS — M1990 Unspecified osteoarthritis, unspecified site: Secondary | ICD-10-CM | POA: Insufficient documentation

## 2019-05-09 HISTORY — PX: COLONOSCOPY WITH PROPOFOL: SHX5780

## 2019-05-09 SURGERY — COLONOSCOPY WITH PROPOFOL
Anesthesia: General

## 2019-05-09 MED ORDER — SODIUM CHLORIDE 0.9 % IV SOLN
INTRAVENOUS | Status: DC
  Administered 2019-05-09: 10:00:00 via INTRAVENOUS

## 2019-05-09 MED ORDER — PROPOFOL 500 MG/50ML IV EMUL
INTRAVENOUS | Status: AC
  Filled 2019-05-09: qty 50

## 2019-05-09 MED ORDER — SODIUM CHLORIDE (PF) 0.9 % IJ SOLN
INTRAMUSCULAR | Status: DC | PRN
  Administered 2019-05-09: 3 mL

## 2019-05-09 MED ORDER — PROPOFOL 500 MG/50ML IV EMUL
INTRAVENOUS | Status: DC | PRN
  Administered 2019-05-09: 145 ug/kg/min via INTRAVENOUS

## 2019-05-09 MED ORDER — PROPOFOL 10 MG/ML IV BOLUS
INTRAVENOUS | Status: DC | PRN
  Administered 2019-05-09: 50 mg via INTRAVENOUS

## 2019-05-09 NOTE — Transfer of Care (Signed)
Immediate Anesthesia Transfer of Care Note  Patient: Cindy Yang  Procedure(s) Performed: COLONOSCOPY WITH PROPOFOL (N/A )  Patient Location: PACU  Anesthesia Type:General  Level of Consciousness: awake and drowsy  Airway & Oxygen Therapy: Patient Spontanous Breathing and Patient connected to face mask oxygen  Post-op Assessment: Report given to RN and Post -op Vital signs reviewed and stable  Post vital signs: Reviewed and stable  Last Vitals:  Vitals Value Taken Time  BP 86/52 05/09/19 1053  Temp 35.7 C 05/09/19 1053  Pulse 66 05/09/19 1058  Resp 23 05/09/19 1058  SpO2 100 % 05/09/19 1058  Vitals shown include unvalidated device data.  Last Pain:  Vitals:   05/09/19 1053  TempSrc: Temporal  PainSc: Asleep         Complications: No apparent anesthesia complications

## 2019-05-09 NOTE — Anesthesia Post-op Follow-up Note (Signed)
Anesthesia QCDR form completed.        

## 2019-05-09 NOTE — Op Note (Signed)
[facility] Gastroenterology Patient Name: Cindy Yang Procedure Date: 05/09/2019 9:55 AM MRN: [id number] Account #: [id number] Date of Birth: 01-14-1956 Admit Type: Outpatient Age: 63 Room: [facility] ENDO ROOM 1 Gender: Female Note Status: Finalized Procedure:             Colonoscopy Indications:           Screening for colorectal malignant neoplasm, This is                         the patient's first colonoscopy Providers:             Lin Landsman MD, MD Referring MD:          Estill Dooms. Lily Peer, MD (Referring MD) Medicines:             Monitored Anesthesia Care Complications:         No immediate complications. Estimated blood loss: None. Procedure:             Pre-Anesthesia Assessment:                        - Prior to the procedure, a History and Physical was                         performed, and patient medications and allergies were                         reviewed. The patient is competent. The risks and                         benefits of the procedure and the sedation options and                         risks were discussed with the patient. All questions                         were answered and informed consent was obtained.                         Patient identification and proposed procedure were                         verified by the physician, the nurse, the                         anesthesiologist, the anesthetist and the technician                         in the pre-procedure area in the procedure room in the                         endoscopy suite. Mental Status Examination: alert and                         oriented. Airway Examination: normal oropharyngeal                         airway and neck mobility. Respiratory Examination:  clear to auscultation. CV Examination: normal.                         Prophylactic Antibiotics: The patient does not require                         prophylactic antibiotics. Prior  Anticoagulants: The                         patient has taken no previous anticoagulant or                         antiplatelet agents. ASA Grade Assessment: II - A                         patient with mild systemic disease. After reviewing                         the risks and benefits, the patient was deemed in                         satisfactory condition to undergo the procedure. The                         anesthesia plan was to use monitored anesthesia care                         (MAC). Immediately prior to administration of                         medications, the patient was re-assessed for adequacy                         to receive sedatives. The heart rate, respiratory                         rate, oxygen saturations, blood pressure, adequacy of                         pulmonary ventilation, and response to care were                         monitored throughout the procedure. The physical                         status of the patient was re-assessed after the                         procedure.                        After obtaining informed consent, the colonoscope was                         passed under direct vision. Throughout the procedure,                         the patient's blood pressure, pulse, and oxygen  saturations were monitored continuously. The                         Colonoscope was introduced through the anus and                         advanced to the the cecum, identified by appendiceal                         orifice and ileocecal valve. The colonoscopy was                         performed with moderate difficulty due to a tortuous                         colon. Successful completion of the procedure was                         aided by applying abdominal pressure. The patient                         tolerated the procedure well. The quality of the bowel                         preparation was evaluated using the BBPS [facility] Bowel                          Preparation Scale) with scores of: Right Colon = 3,                         Transverse Colon = 3 and Left Colon = 3 (entire mucosa                         seen well with no residual staining, small fragments                         of stool or opaque liquid). The total BBPS score                         equals 9. Findings:      The perianal and digital rectal examinations were normal. Pertinent       negatives include normal sphincter tone and no palpable rectal lesions.      A 8 mm polyp was found in the cecum. The polyp was flat and sessile.       Preparations were made for mucosal resection. Saline was injected to       raise the lesion. Snare mucosal resection was performed. Resection and       retrieval were complete.      A 6 mm polyp was found in the ascending colon. The polyp was sessile.       The polyp was removed with a cold snare. Resection and retrieval were       complete.      A 7 mm polyp was found in the descending colon. The polyp was sessile.       The polyp was removed with a hot snare. Resection and retrieval were       complete.  A diminutive polyp was found in the descending colon. The polyp was       sessile. The polyp was removed with a cold biopsy forceps. Resection and       retrieval were complete.      A 8 mm polyp was found in the distal descending colon around a turn. The       polyp was flat. It was lifted with saline adequately. However, when       snare was introduced, could not locate the polyp despite several       attempts traverseing the scope back and forth. Polypectomy was not       attempted due to not finding the polyp (which had been previously       identified).      The retroflexed view of the distal rectum and anal verge was normal and       showed no anal or rectal abnormalities. Impression:            - One 8 mm polyp in the cecum, removed with mucosal                         resection. Resected and  retrieved.                        - One 6 mm polyp in the ascending colon, removed with                         a cold snare. Resected and retrieved.                        - One 7 mm polyp in the descending colon, removed with                         a hot snare. Resected and retrieved.                        - One diminutive polyp in the descending colon,                         removed with a cold biopsy forceps. Resected and                         retrieved.                        - One 8 mm polyp in the distal descending colon.                         Resection not attempted.                        - The distal rectum and anal verge are normal on                         retroflexion view.                        - Mucosal resection was performed. Resection and  retrieval were complete. Recommendation:        - Discharge patient to home (with escort).                        - Resume previous diet today.                        - Continue present medications.                        - Await pathology results.                        - Repeat colonoscopy in 3 years for surveillance of                         multiple polyps. Procedure Code(s):     --- Professional ---                        [phone], Colonoscopy, flexible; with endoscopic mucosal                         resection                        45385, 2, Colonoscopy, flexible; with removal of                         tumor(s), polyp(s), or other lesion(s) by snare                         technique                        45380, 35, Colonoscopy, flexible; with biopsy, single                         or multiple Diagnosis Code(s):     --- Professional ---                        Z12.11, Encounter for screening for malignant neoplasm                         of colon                        K63.5, Polyp of colon CPT copyright 2019 American Medical Association. All rights reserved. The codes documented in this  report are preliminary and upon coder review may  be revised to meet current compliance requirements. Dr. Ulyess Mort Lin Landsman MD, MD 05/09/2019 10:55:24 AM This report has been signed electronically. Number of Addenda: 0 Note Initiated On: 05/09/2019 9:55 AM Scope Withdrawal Time: 0 hours 36 minutes 2 seconds  Total Procedure Duration: 0 hours 43 minutes 45 seconds  Estimated Blood Loss:  Estimated blood loss: none.      [facility]

## 2019-05-09 NOTE — Anesthesia Preprocedure Evaluation (Signed)
Anesthesia Evaluation  Patient identified by MRN, date of birth, ID band Patient awake    Reviewed: Allergy & Precautions, NPO status , Patient's Chart, lab work & pertinent test results  History of Anesthesia Complications Negative for: history of anesthetic complications  Airway Mallampati: II  TM Distance: >3 FB Neck ROM: Full    Dental  (+) Upper Dentures   Pulmonary neg sleep apnea, neg COPD, Current Smoker and Patient abstained from smoking.,    breath sounds clear to auscultation- rhonchi (-) wheezing      Cardiovascular Exercise Tolerance: Good (-) hypertension(-) CAD, (-) Past MI, (-) Cardiac Stents and (-) CABG  Rhythm:Regular Rate:Normal - Systolic murmurs and - Diastolic murmurs    Neuro/Psych neg Seizures negative psych ROS   GI/Hepatic negative GI ROS, Neg liver ROS,   Endo/Other  negative endocrine ROSneg diabetes  Renal/GU negative Renal ROS     Musculoskeletal  (+) Arthritis ,   Abdominal (+) - obese,   Peds  Hematology negative hematology ROS (+)   Anesthesia Other Findings Past Medical History: No date: Dyspnea 10/18/2015: History of prediabetes 10/07/2016: Shoulder tendonitis, left   Reproductive/Obstetrics                             Anesthesia Physical Anesthesia Plan  ASA: II  Anesthesia Plan: General   Post-op Pain Management:    Induction: Intravenous  PONV Risk Score and Plan: 1 and Propofol infusion  Airway Management Planned: Natural Airway  Additional Equipment:   Intra-op Plan:   Post-operative Plan:   Informed Consent: I have reviewed the patients History and Physical, chart, labs and discussed the procedure including the risks, benefits and alternatives for the proposed anesthesia with the patient or authorized representative who has indicated his/her understanding and acceptance.     Dental advisory given  Plan Discussed with: CRNA and  Anesthesiologist  Anesthesia Plan Comments:         Anesthesia Quick Evaluation

## 2019-05-09 NOTE — H&P (Signed)
Cindy Darby, MD [address]  [place]  [place], [place] [address]  Main: [phone]  Fax: [phone] Pager: [phone]  Primary Care Physician:  Talmage Nap, PA-C Primary Gastroenterologist:  Dr. Cephas Yang  Pre-Procedure History & Physical: HPI:  Cindy Yang is a 63 y.o. female is here for an colonoscopy.   Past Medical History:  Diagnosis Date  . Dyspnea   . History of prediabetes 10/18/2015  . Shoulder tendonitis, left 10/07/2016    Past Surgical History:  Procedure Laterality Date  . APPENDECTOMY    . 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 TUBAL LIGATION      Prior to Admission medications   Medication Sig Start Date End Date Taking? Authorizing Provider  fluticasone (FLONASE) 50 MCG/ACT nasal spray Place 2 sprays into both nostrils daily.   Yes [provider]  Cetirizine HCl (ZYRTEC ALLERGY PO) Take 10 mg by mouth daily.    [provider]  varenicline (CHANTIX STARTING MONTH PAK) 0.5 MG X 11 & 1 MG X 42 tablet Take one 0.5 mg tablet by mouth once daily for 3 days, then increase to one 0.5 mg tablet twice daily for 4 days, then increase to one 1 mg tablet twice daily. Patient not taking: Reported on 05/09/2019 04/18/19   Daryll Drown R, PA-C    Allergies as of 04/21/2019  . (No Known Allergies)    Family History  Problem Relation Age of Onset  . Dementia Mother   . Lung cancer Father   . Valvular heart disease Father   . Hypertension Brother   . Breast cancer Maternal Aunt     Social History   Socioeconomic History  . Marital status: Married    Spouse name: Not on file  . Number of children: Not on file  . Years of education: Not on file  . Highest education level: Not on file  Occupational History  . Not on file  Social Needs  . Financial resource  strain: Not on file  . Food insecurity    Worry: Not on file    Inability: Not on file  . Transportation needs    Medical: Not on file    Non-medical: Not on file  Tobacco Use  . Smoking status: Heavy Tobacco Smoker    Packs/day: 1.00    Years: 40.00    Pack years: 40.00    Types: Cigarettes    Start date: 09/25/1973  . Smokeless tobacco: Never Used  Substance and Sexual Activity  . Alcohol use: No  . Drug use: No  . Sexual activity: Yes  Lifestyle  . Physical activity    Days per week: Not on file    Minutes per session: Not on file  . Stress: Not on file  Relationships  . Social Herbalist on phone: Not on file    Gets together: Not on file    Attends religious service: Not on file    Active member of club or organization: Not on file    Attends meetings of clubs or organizations: Not on file    Relationship status: Not on file  . Intimate partner violence    Fear of current or ex partner: Not on file    Emotionally abused: Not on file    Physically abused: Not on file  Forced sexual activity: Not on file  Other Topics Concern  . Not on file  Social History Narrative  . Not on file    Review of Systems: See HPI, otherwise negative ROS  Physical Exam: BP (!) 146/96   Pulse 89   Ht 5\' 7"  (1.702 m)   Wt 75.8 kg   LMP  (Approximate)   SpO2 100%   BMI 26.16 kg/m  General:   Alert,  pleasant and cooperative in NAD Head:  Normocephalic and atraumatic. Neck:  Supple; no masses or thyromegaly. Lungs:  Clear throughout to auscultation.    Heart:  Regular rate and rhythm. Abdomen:  Soft, nontender and nondistended. Normal bowel sounds, without guarding, and without rebound.   Neurologic:  Alert and  oriented x4;  grossly normal neurologically.  Impression/Plan: Cindy Yang is here for an colonoscopy to be performed for colon cancer screening  Risks, benefits, limitations, and alternatives regarding  colonoscopy have been reviewed with the  patient.  Questions have been answered.  All parties agreeable.   Sherri Sear, MD  05/09/2019, 9:58 AM

## 2019-05-10 ENCOUNTER — Encounter: Payer: Self-pay | Admitting: Gastroenterology

## 2019-05-10 LAB — SURGICAL PATHOLOGY

## 2019-05-10 NOTE — Anesthesia Postprocedure Evaluation (Signed)
Anesthesia Post Note  Patient: Cindy Yang  Procedure(s) Performed: COLONOSCOPY WITH PROPOFOL (N/A )  Patient location during evaluation: Endoscopy Anesthesia Type: General Level of consciousness: awake and alert and oriented Pain management: pain level controlled Vital Signs Assessment: post-procedure vital signs reviewed and stable Respiratory status: spontaneous breathing, nonlabored ventilation and respiratory function stable Cardiovascular status: blood pressure returned to baseline and stable Postop Assessment: no signs of nausea or vomiting Anesthetic complications: no     Last Vitals:  Vitals:   05/09/19 1113 05/09/19 1123  BP: 139/74 (!) 145/69  Pulse: 63 (!) 58  Resp: 14 15  Temp:    SpO2: 100% 100%    Last Pain:  Vitals:   05/09/19 1123  TempSrc:   PainSc: 0-No pain                 Gerda Yin

## 2019-05-11 ENCOUNTER — Telehealth: Payer: Self-pay | Admitting: Medical

## 2019-05-11 ENCOUNTER — Ambulatory Visit
Admission: RE | Admit: 2019-05-11 | Discharge: 2019-05-11 | Disposition: A | Discharge status: Home / Self Care | Payer: BC Managed Care – PPO | Source: Ambulatory Visit | Attending: Medical | Admitting: Medical

## 2019-05-11 DIAGNOSIS — J189 Pneumonia, unspecified organism: Secondary | ICD-10-CM

## 2019-05-11 DIAGNOSIS — Z122 Encounter for screening for malignant neoplasm of respiratory organs: Secondary | ICD-10-CM

## 2019-05-11 MED ORDER — LEVOFLOXACIN 500 MG PO TABS: 500.0000 mg | ORAL_TABLET | Freq: Every day | ORAL | 0 refills | Status: DC

## 2019-05-11 NOTE — Telephone Encounter (Signed)
  I reviewed the low dose chest CT results with Dr. Rosanna Randy. Patient will be scheduled for an appointment on Monday.  I placed the patient on Levaquin 500 mg one po qd x 10 d #10.  Patient denies fever or chills, SOB or CP, no abdominal pain or diarrhea. Covid -19 test 05-05-2019  was negativ.. She does have a cough with clear sputum but thinks it is from smoking and she states is nothing new. She has not started her Chantix yet. Reviewed with patient if she needs her Albuterol MDI to use per instructions as needed.  Meds ordered this encounter  Medications  . DISCONTD: levofloxacin (LEVAQUIN) 500 MG tablet    Sig: Take 1 tablet (500 mg total) by mouth daily.    Dispense:  10 tablet    Refill:  0  . levofloxacin (LEVAQUIN) 500 MG tablet    Sig: Take 1 tablet (500 mg total) by mouth daily.    Dispense:  10 tablet    Refill:  0  Results of lung CT CLINICAL DATA:  Current smoker, 40 pack-year history.  EXAM: CT CHEST WITHOUT CONTRAST LOW-DOSE FOR LUNG CANCER SCREENING  TECHNIQUE: Multidetector CT imaging of the chest was performed following the standard protocol without IV contrast.  COMPARISON:  None.  FINDINGS: Cardiovascular: Atherosclerotic calcification of the aorta and coronary arteries. Heart size normal. No pericardial effusion.  Mediastinum/Nodes: No pathologically enlarged mediastinal or axillary lymph nodes. Hilar regions are difficult to definitively evaluate without IV contrast. Prepericardiac lymph nodes are not enlarged by CT size criteria. Esophagus is grossly unremarkable.  Lungs/Pleura: Biapical pleuroparenchymal scarring. Centrilobular and paraseptal emphysema. Upper and midlung zone predominant peribronchovascular nodular ground-glass. Left lower lobe nodule measures 8.7 mm (9/106). Additional bilateral pulmonary nodules measure 4.6 mm or less in size. No pleural fluid. Airway is unremarkable.  Upper Abdomen: Visualized portions of the liver,  adrenal glands, kidneys, spleen, pancreas, stomach and bowel are grossly unremarkable.  Musculoskeletal: Degenerative changes in the spine. No worrisome lytic or sclerotic lesions.  IMPRESSION: 1. 8.7 mm left lower lobe nodule. Lung-RADS 4A, suspicious. Follow up low-dose chest CT without contrast in 3 months (please use the following order, "CT CHEST LCS NODULE FOLLOW-UP W/O CM") is recommended. Alternatively, PET may be considered when there is a solid component 29mm or larger. These results will be called to the ordering clinician or representative by the Radiologist Assistant, and communication documented in the PACS or zVision Dashboard. 2. Upper and midlung zone predominant peribronchovascular nodular ground-glass can be seen with a viral/atypical pneumonia. Respiratory bronchiolitis/interstitial lung disease not excluded. 3. Aortic atherosclerosis (ICD10-170.0). Coronary artery calcification. 4.  Emphysema (ICD10-J43.9).   Electronically Signed   By: Lorin Picket M.D.   On: 05/11/2019 11:28

## 2019-05-16 ENCOUNTER — Encounter: Payer: Self-pay | Admitting: Medical

## 2019-05-16 ENCOUNTER — Ambulatory Visit: Payer: Self-pay | Admitting: Medical

## 2019-05-16 ENCOUNTER — Other Ambulatory Visit: Payer: Self-pay

## 2019-05-16 VITALS — BP 131/74 | HR 89 | Temp 98.5°F | Resp 18 | Wt 169.8 lb

## 2019-05-16 DIAGNOSIS — I700 Atherosclerosis of aorta: Secondary | ICD-10-CM

## 2019-05-16 DIAGNOSIS — J439 Emphysema, unspecified: Secondary | ICD-10-CM

## 2019-05-16 DIAGNOSIS — R911 Solitary pulmonary nodule: Secondary | ICD-10-CM

## 2019-05-16 DIAGNOSIS — J189 Pneumonia, unspecified organism: Secondary | ICD-10-CM

## 2019-05-16 NOTE — Progress Notes (Signed)
63 yo female in non acute distress her to  Reviewed Low dose CT of the lungs results with Dr. Rosanna Randy he recommends  [facility] for continued care. Patient uses Albuterol MDI once a day at bedtime. Some times hears wheezing at night. Did not get Chantix, she is worried about cost. I asked that she check on the price and if she cannot afford medicine, I will try her on Wellbutrin. Reviewed results of CT with patient. Reassured patient that she needs further testing for an accurate diagnosis.. She states she has no questions at this time. I told her that if she has questions to please call the office or make an appointment and I will talk to her. Patient is very stoic during our conversation. Cough improved productive clear it was white, no SOB or CP, no fever or chills per patient . Negative Covid-19 test 05/05/2019.  Blood pressure 131/74, pulse 89, temperature 98.5 F (36.9 C), temperature source Tympanic, resp. rate 18, weight 169 lb 12.8 oz (77 kg), SpO2 98 %.  No Known Allergies   Current Outpatient Medications:  .  Cetirizine HCl (ZYRTEC ALLERGY PO), Take 10 mg by mouth daily., Disp: , Rfl:  .  fluticasone (FLONASE) 50 MCG/ACT nasal spray, Place 2 sprays into both nostrils daily., Disp: , Rfl:  .  levofloxacin (LEVAQUIN) 500 MG tablet, Take 1 tablet (500 mg total) by mouth daily., Disp: 10 tablet, Rfl: 0 .  varenicline (CHANTIX STARTING MONTH PAK) 0.5 MG X 11 & 1 MG X 42 tablet, Take one 0.5 mg tablet by mouth once daily for 3 days, then increase to one 0.5 mg tablet twice daily for 4 days, then increase to one 1 mg tablet twice daily. (Patient not taking: Reported on 05/09/2019), Disp: 53 tablet, Rfl: 0  Orders Placed This Encounter  Procedures  . Ambulatory referral to Pulmonology    Referral Priority:   Urgent    Referral Type:   Consultation    Referral Reason:   Specialty Services Required    Requested Specialty:   Pulmonary Disease    Number of Visits Requested:   1     She denies SOB, CP or fever or chills and she states she states she  feels better since she started the antibiotic Levaquin started on 05/11/2019.Marland Kitchen  CLINICAL DATA:  Current smoker, 40 pack-year history.  EXAM: CT CHEST WITHOUT CONTRAST LOW-DOSE FOR LUNG CANCER SCREENING  TECHNIQUE: Multidetector CT imaging of the chest was performed following the standard protocol without IV contrast.  COMPARISON:  None.  FINDINGS: Cardiovascular: Atherosclerotic calcification of the aorta and coronary arteries. Heart size normal. No pericardial effusion.  Mediastinum/Nodes: No pathologically enlarged mediastinal or axillary lymph nodes. Hilar regions are difficult to definitively evaluate without IV contrast. Prepericardiac lymph nodes are not enlarged by CT size criteria. Esophagus is grossly unremarkable.  Lungs/Pleura: Biapical pleuroparenchymal scarring. Centrilobular and paraseptal emphysema. Upper and midlung zone predominant peribronchovascular nodular ground-glass. Left lower lobe nodule measures 8.7 mm (9/106). Additional bilateral pulmonary nodules measure 4.6 mm or less in size. No pleural fluid. Airway is unremarkable.  Upper Abdomen: Visualized portions of the liver, adrenal glands, kidneys, spleen, pancreas, stomach and bowel are grossly unremarkable.  Musculoskeletal: Degenerative changes in the spine. No worrisome lytic or sclerotic lesions.  IMPRESSION: 1. 8.7 mm left lower lobe nodule. Lung-RADS 4A, suspicious. Follow up low-dose chest CT without contrast in 3 months (please use the following order, "CT CHEST LCS NODULE FOLLOW-UP W/O CM") is recommended. Alternatively, PET may be  considered when there is a solid component 62mm or larger. These results will be called to the ordering clinician or representative by the Radiologist Assistant, and communication documented in the PACS or zVision Dashboard. 2. Upper and midlung zone predominant peribronchovascular  nodular ground-glass can be seen with a viral/atypical pneumonia. Respiratory bronchiolitis/interstitial lung disease not excluded. 3. Aortic atherosclerosis (ICD10-170.0). Coronary artery calcification. 4.  Emphysema (ICD10-J43.9).   Electronically Signed   By: Lorin Picket M.D.   On: 05/11/2019 11:28  Diagnosis/plan Left lower lung nodule, Aortic atherosclerosis, atypical pneumonia, Emphysema. Diagnosis as above. Family history mother and father with lung cancer. Levaquin 50 0mg  one qd x 10 days. Albuterol MDI take as needed. Referral made to pulmonary. Encouraged patient to try the Chantix, reduce or quit smoking. If any problems SOB, CP, fever or chills patient to seek medical attention. .  Reviewed with patient that she can get  In contact with  me if she has any questions or concerns by calling the office. Patient verbalizes understanding and has no questions at this time.

## 2019-05-19 NOTE — Progress Notes (Signed)
Synopsis: Referred in December 2020 for abnormal chest CT by Talmage Nap, P*.  Subjective:   PATIENT ID: Cindy Yang GENDER: female DOB: 1955-09-19, MRN: [id number]  Chief Complaint  Patient presents with  . Consult    Patient is here for abnormal x-ray. Nodule on lower left lung and groud glass appearance.    Cindy Yang is a 63 year old woman who presents with her husband for evaluation of an abnormal CT scan.  The CT scan was performed due to coughing that had been going on for several months, since before the summer.  It was productive of clear sputum, but she was worse a few weeks ago, requiring her to take a week off of work.  The day of her CT scan she was started on levofloxacin, which is helped her symptoms.  Previously she had tried over-the-counter cough medications, which have not worked.  She has mild dyspnea on exertion-she works at [employer] and is able to walk all over campus.  She denies wheezing.  She has a history of tobacco abuse, smoking 1 pack/day for the last 40 years.  She wants to quit, and her husband is previously quit.  She tried quitting cold [place] and was successful for 1 month in the past.  Most recently she was considering starting nicotine replacement therapy.  She was prescribed Chantix by her PCP, but is concerned about side effects.  She has a history of allergic rhinitis for which he takes cetirizine and Flonase.  She has a family history of lung cancer in both parents.  She has no previous pulmonary diagnosis.     Past Medical History:  Diagnosis Date  . Dyspnea   . History of prediabetes 10/18/2015  . Shoulder tendonitis, left 10/07/2016     Family History  Problem Relation Age of Onset  . Dementia Mother   . Lung cancer Mother   . Lung cancer Father   . Valvular heart disease Father   . Hypertension Brother   . Breast cancer Maternal Aunt      Past Surgical History:  Procedure Laterality Date  . APPENDECTOMY    . COLONOSCOPY WITH  PROPOFOL N/A 05/09/2019   Procedure: COLONOSCOPY WITH PROPOFOL;  Surgeon: Lin Landsman, MD;  Location: [facility] ENDOSCOPY;  Service: Gastroenterology;  Laterality: N/A;  . 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 TUBAL LIGATION      Social History   Socioeconomic History  . Marital status: Married    Spouse name: Not on file  . Number of children: Not on file  . Years of education: Not on file  . Highest education level: Not on file  Occupational History  . Not on file  Social Needs  . Financial resource strain: Not on file  . Food insecurity    Worry: Not on file    Inability: Not on file  . Transportation needs    Medical: Not on file    Non-medical: Not on file  Tobacco Use  . Smoking status: Heavy Tobacco Smoker    Packs/day: 1.00    Years: 40.00    Pack years: 40.00    Types: Cigarettes    Start date: 09/25/1973  . Smokeless tobacco: Never Used  Substance and Sexual Activity  . Alcohol use: No  . Drug use: No  . Sexual activity: Yes  Lifestyle  . Physical  activity    Days per week: Not on file    Minutes per session: Not on file  . Stress: Not on file  Relationships  . Social Herbalist on phone: Not on file    Gets together: Not on file    Attends religious service: Not on file    Active member of club or organization: Not on file    Attends meetings of clubs or organizations: Not on file    Relationship status: Not on file  . Intimate partner violence    Fear of current or ex partner: Not on file    Emotionally abused: Not on file    Physically abused: Not on file    Forced sexual activity: Not on file  Other Topics Concern  . Not on file  Social History Narrative  . Not on file     No Known Allergies   Immunization History  Administered Date(s) Administered  . Influenza,inj,Quad PF,6+ Mos  03/29/2019  . Influenza-Unspecified 04/14/2017, 04/19/2018  . Tdap 04/18/2019    Outpatient Medications Prior to Visit  Medication Sig Dispense Refill  . albuterol (VENTOLIN HFA) 108 (90 Base) MCG/ACT inhaler Inhale 2 puffs into the lungs every 6 (six) hours as needed for wheezing or shortness of breath.    . Cetirizine HCl (ZYRTEC ALLERGY PO) Take 10 mg by mouth daily.    . fluticasone (FLONASE) 50 MCG/ACT nasal spray Place 2 sprays into both nostrils daily.    Marland Kitchen levofloxacin (LEVAQUIN) 500 MG tablet Take 1 tablet (500 mg total) by mouth daily. 10 tablet 0  . varenicline (CHANTIX STARTING MONTH PAK) 0.5 MG X 11 & 1 MG X 42 tablet Take one 0.5 mg tablet by mouth once daily for 3 days, then increase to one 0.5 mg tablet twice daily for 4 days, then increase to one 1 mg tablet twice daily. (Patient not taking: Reported on 05/09/2019) 53 tablet 0   No facility-administered medications prior to visit.     Review of Systems  Constitutional: Negative for chills, fever and weight loss.  HENT: Negative.   Respiratory: Positive for cough, sputum production and shortness of breath. Negative for wheezing.   Cardiovascular: Negative for chest pain, palpitations and leg swelling.  Gastrointestinal: Negative for heartburn, nausea and vomiting.  Genitourinary: Negative.   Musculoskeletal: Negative.   Skin: Negative for rash.  Neurological: Negative.   Endo/Heme/Allergies: Negative.      Objective:   Vitals:   05/20/19 1443  BP: 132/72  Pulse: 81  Temp: (!) 97.3 F (36.3 C)  TempSrc: Temporal  SpO2: 98%  Weight: 165 lb 9.6 oz (75.1 kg)  Height: 5' 6.54" (1.69 m)   98% on   RA BMI Readings from Last 3 Encounters:  05/20/19 26.30 kg/m  05/16/19 26.59 kg/m  05/09/19 26.16 kg/m   Wt Readings from Last 3 Encounters:  05/20/19 165 lb 9.6 oz (75.1 kg)  05/16/19 169 lb 12.8 oz (77 kg)  05/09/19 167 lb (75.8 kg)    Physical Exam Vitals signs reviewed.  Constitutional:       Appearance: Normal appearance. She is not ill-appearing.  HENT:     Head: Normocephalic and atraumatic.     Nose:     Comments: Deferred due to masking requirement.    Mouth/Throat:     Comments: Deferred due to masking requirement. Eyes:     General: No scleral icterus. Neck:     Musculoskeletal: Neck supple.  Cardiovascular:  Rate and Rhythm: Normal rate and regular rhythm.     Heart sounds: No murmur.  Pulmonary:     Comments: Breathing comfortably on room air, no conversational dyspnea.  Clear to auscultation bilaterally.  No witnessed coughing. Abdominal:     General: There is no distension.     Palpations: Abdomen is soft.     Tenderness: There is no abdominal tenderness.  Musculoskeletal:        General: No swelling.     Comments: Clubbing-reports this is chronic  Lymphadenopathy:     Cervical: No cervical adenopathy.  Skin:    General: Skin is warm and dry.     Findings: No rash.  Neurological:     Mental Status: She is alert.     Motor: No weakness.     Coordination: Coordination normal.  Psychiatric:        Behavior: Behavior normal.      CBC    Component Value Date/Time   WBC 6.7 03/31/2019 0747   WBC 8.4 12/17/2016 0330   RBC 5.31 (H) 03/31/2019 0747   RBC 4.47 12/17/2016 0330   HGB 15.0 03/31/2019 0747   HCT 44.4 03/31/2019 0747   PLT 203 03/31/2019 0747   MCV 84 03/31/2019 0747   MCH 28.2 03/31/2019 0747   MCH 28.4 12/17/2016 0330   MCHC 33.8 03/31/2019 0747   MCHC 34.4 12/17/2016 0330   RDW 14.4 03/31/2019 0747   LYMPHSABS 2.7 03/31/2019 0747   EOSABS 0.1 03/31/2019 0747   BASOSABS 0.1 03/31/2019 0747    CHEMISTRY No results for input(s): NA, K, CL, CO2, GLUCOSE, BUN, CREATININE, CALCIUM, MG, PHOS in the last 168 hours. CrCl cannot be calculated (Patient's most recent lab result is older than the maximum 21 days allowed.).   Chest Imaging- films reviewed: CT chest 01/08/2019 - emphysema, innumerable groundglass nodules most prominent  in bilateral upper lobes.  Occasional pleural studding, thickening of interlobular septae.  Mosaicism.  Airway thickening without bronchiectasis.  Superior left lower lobe 8.7 mm nodule, possibly also inflammatory in nature.  Overall could be consistent with smoking-related ILD or an atypical pneumonia.  Pulmonary Functions Testing Results: No flowsheet data found.       Assessment & Plan:     ICD-10-CM   1. Abnormal chest CT  R93.89   2. Tobacco abuse  Z72.0   3. Abnormal findings on diagnostic imaging of lung  R91.8 CT Chest Wo Contrast  4. Chronic cough  R05      Abnormal chest CT-very concerning for smoking induced ILD versus less likely viral pneumonia.  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 -Strongly recommended smoking cessation.  Discussed the risks, benefits of continued tobacco abuse and various quitting tools.  She wants to try Chantix.  She understands the potential risk of nightmares, depression, suicidal ideation, and all of these are indications to stop the medication immediately.  She has a good support system. -PFTs -Repeat CT scan after 3 months.  If abnormalities persist, she would likely require surgical lung biopsy  Pulmonary nodules interspersed with groundglass opacities of unclear significance -Repeat CT in 3 months  Dyspnea on exertion -Start Spiriva once daily -PFTs  RTC in 3 months after CT scan and PFTs.   Current Outpatient Medications:  .  albuterol (VENTOLIN HFA) 108 (90 Base) MCG/ACT inhaler, Inhale 2 puffs into the lungs every 6 (six) hours as needed for wheezing or shortness of breath.,  Disp: , Rfl:  .  Cetirizine HCl (ZYRTEC ALLERGY PO), Take 10 mg by mouth daily., Disp: , Rfl:  .  fluticasone (FLONASE) 50 MCG/ACT nasal spray, Place 2 sprays into both nostrils daily., Disp: , Rfl:  .  levofloxacin (LEVAQUIN) 500 MG tablet, Take 1 tablet (500 mg total) by mouth daily.,  Disp: 10 tablet, Rfl: 0 .  varenicline (CHANTIX STARTING MONTH PAK) 0.5 MG X 11 & 1 MG X 42 tablet, Take one 0.5 mg tablet by mouth once daily for 3 days, then increase to one 0.5 mg tablet twice daily for 4 days, then increase to one 1 mg tablet twice daily. (Patient not taking: Reported on 05/09/2019), Disp: 53 tablet, Rfl: 0   Julian Hy, DO [facility] 05/20/2019 3:01 PM

## 2019-05-20 ENCOUNTER — Other Ambulatory Visit: Payer: Self-pay

## 2019-05-20 ENCOUNTER — Ambulatory Visit (INDEPENDENT_AMBULATORY_CARE_PROVIDER_SITE_OTHER): Payer: BC Managed Care – PPO | Admitting: Critical Care Medicine

## 2019-05-20 ENCOUNTER — Encounter: Payer: Self-pay | Admitting: Critical Care Medicine

## 2019-05-20 VITALS — BP 132/72 | HR 81 | Temp 97.3°F | Ht 66.54 in | Wt 165.6 lb

## 2019-05-20 DIAGNOSIS — R05 Cough: Secondary | ICD-10-CM

## 2019-05-20 DIAGNOSIS — R0609 Other forms of dyspnea: Secondary | ICD-10-CM

## 2019-05-20 DIAGNOSIS — R053 Chronic cough: Secondary | ICD-10-CM

## 2019-05-20 DIAGNOSIS — R0600 Dyspnea, unspecified: Secondary | ICD-10-CM

## 2019-05-20 DIAGNOSIS — Z720 Tobacco use: Secondary | ICD-10-CM

## 2019-05-20 DIAGNOSIS — R918 Other nonspecific abnormal finding of lung field: Secondary | ICD-10-CM | POA: Diagnosis not present

## 2019-05-20 DIAGNOSIS — R9389 Abnormal findings on diagnostic imaging of other specified body structures: Secondary | ICD-10-CM | POA: Diagnosis not present

## 2019-05-20 MED ORDER — SPIRIVA HANDIHALER 18 MCG IN CAPS: 18.0000 ug | ORAL_CAPSULE | Freq: Every day | RESPIRATORY_TRACT | 12 refills | Status: AC

## 2019-05-20 NOTE — Patient Instructions (Signed)
Thank you for visiting Dr. Carlis Abbott at [facility]. We recommend the following: Orders Placed This Encounter  Procedures  . CT Chest High Resolution  . Pulmonary function test   Orders Placed This Encounter  Procedures  . CT Chest High Resolution    After Aug 11, 2019 please  For 3 month follow up    Standing Status:   Future    Standing Expiration Date:   07/20/2020    Order Specific Question:   ** REASON FOR EXAM (FREE TEXT)    Answer:   ILD protocol- concern for smoking associated ILD from previous CT    Order Specific Question:   Preferred imaging location?    Answer:   [facility]    Order Specific Question:   Radiology Contrast Protocol - do NOT remove file path    Answer:   [email or web address]  . Pulmonary function test    Standing Status:   Future    Standing Expiration Date:   05/18/2020    Order Specific Question:   Where should this test be performed?    Answer:   [facility]    Order Specific Question:   Full PFT: includes the following: basic spirometry, spirometry pre & post bronchodilator, diffusion capacity (DLCO), lung volumes    Answer:   Full PFT    Meds ordered this encounter  Medications  . tiotropium (SPIRIVA HANDIHALER) 18 MCG inhalation capsule    Sig: Place 1 capsule (18 mcg total) into inhaler and inhale daily.    Dispense:  30 capsule    Refill:  12    Return in about 3 months (around 08/18/2019). with Dr Carlis Abbott    Please do your part to reduce the spread of COVID-19.

## 2019-07-08 ENCOUNTER — Other Ambulatory Visit: Payer: Self-pay

## 2019-07-08 ENCOUNTER — Telehealth: Payer: Self-pay | Admitting: Nurse Practitioner

## 2019-07-08 DIAGNOSIS — M5442 Lumbago with sciatica, left side: Secondary | ICD-10-CM

## 2019-07-08 MED ORDER — PREDNISONE 10 MG (21) PO TBPK: ORAL_TABLET | ORAL | 0 refills | Status: DC

## 2019-07-08 NOTE — Progress Notes (Signed)
   Subjective:    Patient ID: Cindy Yang, female    DOB: [birth year], 64 y.o.   MRN: [id number]  HPI Cindy Yang is on a telemedicine call today with consent to treat. She reports low back pain with pain that radiates down left leg. She denies any hx of injury but does admit when she return to work that day she had to carry a lot of trash up and down the stairs. Denies any sudden onset of pain or injury. She reports some numbness to her left leg and reports low dull back pain 8/10. Cindy Yang endorses this pain to be constant. She's been taking Tylenol extra strength routinely since that time with some relief. Denies loss of bowel/bladder. She reports she's not been doing much and has been laying around. She does reports about 3 years ago she had this type of pain but it was found to be her left hip and she had a hip replacement but resolved years ago after her surgery.    Review of Systems  Musculoskeletal: Positive for back pain.       Low back pain  Neurological: Positive for numbness.       Objective:   Physical Exam Telemedicine visit       Assessment & Plan:

## 2019-07-08 NOTE — Patient Instructions (Signed)
Start using heat therapy to low back It's important to continue to move I want you to start stretching  Continue Tylenol as directed Encouraged patient to call the office or primary care doctor for an appointment if no improvement in symptoms or if symptoms change or worsen after 72 hours of planned treatment. Patient verbalized understanding of all instructions given/reviewed and has no further questions or concerns at this time.

## 2019-07-15 ENCOUNTER — Other Ambulatory Visit (HOSPITAL_COMMUNITY)
Admission: RE | Admit: 2019-07-15 | Discharge: 2019-07-15 | Disposition: A | Discharge status: Home / Self Care | Payer: BC Managed Care – PPO | Source: Ambulatory Visit | Attending: Critical Care Medicine | Admitting: Critical Care Medicine

## 2019-07-15 DIAGNOSIS — Z01812 Encounter for preprocedural laboratory examination: Secondary | ICD-10-CM | POA: Insufficient documentation

## 2019-07-15 DIAGNOSIS — Z20822 Contact with and (suspected) exposure to covid-19: Secondary | ICD-10-CM | POA: Diagnosis not present

## 2019-07-15 LAB — SARS CORONAVIRUS 2 (TAT 6-24 HRS): SARS Coronavirus 2: NEGATIVE

## 2019-07-18 ENCOUNTER — Other Ambulatory Visit: Payer: Self-pay

## 2019-07-18 ENCOUNTER — Ambulatory Visit (INDEPENDENT_AMBULATORY_CARE_PROVIDER_SITE_OTHER): Payer: BC Managed Care – PPO | Admitting: Critical Care Medicine

## 2019-07-18 DIAGNOSIS — R05 Cough: Secondary | ICD-10-CM

## 2019-07-18 DIAGNOSIS — R9389 Abnormal findings on diagnostic imaging of other specified body structures: Secondary | ICD-10-CM | POA: Diagnosis not present

## 2019-07-18 DIAGNOSIS — R053 Chronic cough: Secondary | ICD-10-CM

## 2019-07-18 DIAGNOSIS — R0609 Other forms of dyspnea: Secondary | ICD-10-CM

## 2019-07-18 DIAGNOSIS — R918 Other nonspecific abnormal finding of lung field: Secondary | ICD-10-CM

## 2019-07-18 DIAGNOSIS — Z720 Tobacco use: Secondary | ICD-10-CM

## 2019-07-18 LAB — PULMONARY FUNCTION TEST
DL/VA % pred: 77 %
DL/VA: 3.17 ml/min/mmHg/L
DLCO unc % pred: 54 %
DLCO unc: 12.06 ml/min/mmHg
FEF 25-75 Post: 1.17 L/sec
FEF 25-75 Pre: 1.27 L/sec
FEF2575-%Change-Post: -7 %
FEF2575-%Pred-Post: 53 %
FEF2575-%Pred-Pre: 58 %
FEV1-%Change-Post: -1 %
FEV1-%Pred-Post: 73 %
FEV1-%Pred-Pre: 74 %
FEV1-Post: 1.67 L
FEV1-Pre: 1.7 L
FEV1FVC-%Change-Post: -2 %
FEV1FVC-%Pred-Pre: 92 %
FEV6-%Change-Post: 0 %
FEV6-%Pred-Post: 83 %
FEV6-%Pred-Pre: 82 %
FEV6-Post: 2.35 L
FEV6-Pre: 2.34 L
FEV6FVC-%Pred-Post: 103 %
FEV6FVC-%Pred-Pre: 103 %
FVC-%Change-Post: 0 %
FVC-%Pred-Post: 80 %
FVC-%Pred-Pre: 79 %
FVC-Post: 2.35 L
FVC-Pre: 2.34 L
Post FEV1/FVC ratio: 71 %
Post FEV6/FVC ratio: 100 %
Pre FEV1/FVC ratio: 73 %
Pre FEV6/FVC Ratio: 100 %
RV % pred: 86 %
RV: 1.9 L
TLC % pred: 80 %
TLC: 4.42 L

## 2019-07-18 NOTE — Progress Notes (Signed)
PFT done today. 

## 2019-07-20 ENCOUNTER — Other Ambulatory Visit: Payer: Self-pay | Admitting: Medical

## 2019-07-20 DIAGNOSIS — Z0000 Encounter for general adult medical examination without abnormal findings: Secondary | ICD-10-CM

## 2019-07-20 DIAGNOSIS — E559 Vitamin D deficiency, unspecified: Secondary | ICD-10-CM

## 2019-07-20 NOTE — Progress Notes (Signed)
Follow up  3 month check on labs.

## 2019-07-21 ENCOUNTER — Other Ambulatory Visit: Payer: Self-pay

## 2019-07-21 DIAGNOSIS — Z0000 Encounter for general adult medical examination without abnormal findings: Secondary | ICD-10-CM

## 2019-07-21 DIAGNOSIS — E559 Vitamin D deficiency, unspecified: Secondary | ICD-10-CM

## 2019-07-22 LAB — CMP12+LP+TP+TSH+6AC+CBC/D/PLT
ALT: 12 IU/L (ref 0–32)
AST: 12 IU/L (ref 0–40)
Albumin/Globulin Ratio: 1.7 (ref 1.2–2.2)
Albumin: 4 g/dL (ref 3.8–4.8)
Alkaline Phosphatase: 85 IU/L (ref 39–117)
BUN/Creatinine Ratio: 16 (ref 12–28)
BUN: 11 mg/dL (ref 8–27)
Basophils Absolute: 0 10*3/uL (ref 0.0–0.2)
Basos: 1 %
Bilirubin Total: 0.4 mg/dL (ref 0.0–1.2)
Calcium: 9 mg/dL (ref 8.7–10.3)
Chloride: 105 mmol/L (ref 96–106)
Chol/HDL Ratio: 3.6 ratio (ref 0.0–4.4)
Cholesterol, Total: 199 mg/dL (ref 100–199)
Creatinine, Ser: 0.7 mg/dL (ref 0.57–1.00)
EOS (ABSOLUTE): 0.1 10*3/uL (ref 0.0–0.4)
Eos: 1 %
Estimated CHD Risk: 0.6 times avg. (ref 0.0–1.0)
Free Thyroxine Index: 5.1 — ABNORMAL HIGH (ref 1.2–4.9)
GFR calc Af Amer: 107 mL/min/{1.73_m2} (ref >59)
GFR calc non Af Amer: 93 mL/min/{1.73_m2} (ref >59)
GGT: 15 IU/L (ref 0–60)
Globulin, Total: 2.3 g/dL (ref 1.5–4.5)
Glucose: 92 mg/dL (ref 65–99)
HDL: 56 mg/dL (ref >39)
Hematocrit: 41.5 % (ref 34.0–46.6)
Hemoglobin: 14.1 g/dL (ref 11.1–15.9)
Immature Grans (Abs): 0 10*3/uL (ref 0.0–0.1)
Immature Granulocytes: 1 %
Iron: 90 ug/dL (ref 27–139)
LDH: 220 IU/L (ref 119–226)
LDL Chol Calc (NIH): 126 mg/dL — ABNORMAL HIGH (ref 0–99)
Lymphocytes Absolute: 2.3 10*3/uL (ref 0.7–3.1)
Lymphs: 41 %
MCH: 27.8 pg (ref 26.6–33.0)
MCHC: 34 g/dL (ref 31.5–35.7)
MCV: 82 fL (ref 79–97)
Monocytes Absolute: 0.7 10*3/uL (ref 0.1–0.9)
Monocytes: 12 %
Neutrophils Absolute: 2.5 10*3/uL (ref 1.4–7.0)
Neutrophils: 44 %
Phosphorus: 3.7 mg/dL (ref 3.0–4.3)
Platelets: 278 10*3/uL (ref 150–450)
Potassium: 4.4 mmol/L (ref 3.5–5.2)
RBC: 5.07 x10E6/uL (ref 3.77–5.28)
RDW: 15 % (ref 11.7–15.4)
Sodium: 142 mmol/L (ref 134–144)
T3 Uptake Ratio: 42 % — ABNORMAL HIGH (ref 24–39)
T4, Total: 12.1 ug/dL — ABNORMAL HIGH (ref 4.5–12.0)
TSH: 0.741 u[IU]/mL (ref 0.450–4.500)
Total Protein: 6.3 g/dL (ref 6.0–8.5)
Triglycerides: 94 mg/dL (ref 0–149)
Uric Acid: 6.5 mg/dL (ref 3.0–7.2)
VLDL Cholesterol Cal: 17 mg/dL (ref 5–40)
WBC: 5.6 10*3/uL (ref 3.4–10.8)

## 2019-07-22 LAB — VITAMIN D 25 HYDROXY (VIT D DEFICIENCY, FRACTURES): Vit D, 25-Hydroxy: 9.4 ng/mL — ABNORMAL LOW (ref 30.0–100.0)

## 2019-07-27 ENCOUNTER — Other Ambulatory Visit: Payer: Self-pay | Admitting: Medical

## 2019-07-27 DIAGNOSIS — E559 Vitamin D deficiency, unspecified: Secondary | ICD-10-CM

## 2019-08-03 MED ORDER — VITAMIN D (ERGOCALCIFEROL) 1.25 MG (50000 UNIT) PO CAPS: 50000.0000 [IU] | ORAL_CAPSULE | ORAL | 0 refills | Status: DC

## 2019-08-03 NOTE — Progress Notes (Signed)
Sent vitamin D tablets to patients pharmacy.

## 2019-08-12 ENCOUNTER — Other Ambulatory Visit: Payer: Self-pay

## 2019-08-12 ENCOUNTER — Ambulatory Visit
Admission: RE | Admit: 2019-08-12 | Discharge: 2019-08-12 | Disposition: A | Discharge status: Home / Self Care | Payer: BC Managed Care – PPO | Source: Ambulatory Visit | Attending: Critical Care Medicine | Admitting: Critical Care Medicine

## 2019-08-12 DIAGNOSIS — Z720 Tobacco use: Secondary | ICD-10-CM | POA: Insufficient documentation

## 2019-08-12 DIAGNOSIS — R918 Other nonspecific abnormal finding of lung field: Secondary | ICD-10-CM | POA: Insufficient documentation

## 2019-08-12 DIAGNOSIS — R9389 Abnormal findings on diagnostic imaging of other specified body structures: Secondary | ICD-10-CM | POA: Insufficient documentation

## 2019-08-12 DIAGNOSIS — R0609 Other forms of dyspnea: Secondary | ICD-10-CM

## 2019-08-12 DIAGNOSIS — R0600 Dyspnea, unspecified: Secondary | ICD-10-CM | POA: Diagnosis present

## 2019-08-12 NOTE — Progress Notes (Signed)
Please let Ms. Follmer know that her CT looks stable and we will continue to follow it. No new plans. Thanks!

## 2019-08-15 ENCOUNTER — Other Ambulatory Visit: Payer: Self-pay

## 2019-08-15 DIAGNOSIS — R05 Cough: Secondary | ICD-10-CM

## 2019-08-15 DIAGNOSIS — R053 Chronic cough: Secondary | ICD-10-CM

## 2019-08-15 DIAGNOSIS — Z720 Tobacco use: Secondary | ICD-10-CM

## 2019-08-15 DIAGNOSIS — R0609 Other forms of dyspnea: Secondary | ICD-10-CM

## 2019-08-27 ENCOUNTER — Ambulatory Visit: Payer: BC Managed Care – PPO | Attending: Internal Medicine

## 2019-08-27 ENCOUNTER — Other Ambulatory Visit: Payer: Self-pay

## 2019-08-27 DIAGNOSIS — Z23 Encounter for immunization: Secondary | ICD-10-CM

## 2019-08-27 NOTE — Progress Notes (Signed)
   Covid-19 Vaccination Clinic  Name:  Cindy Yang    MRN: [id number] DOB: 05-Jun-1956  08/27/2019  [name] was observed post Covid-19 immunization for 15 minutes without incident. She was provided with Vaccine Information Sheet and instruction to access the V-Safe system.   [name] was instructed to call 911 with any severe reactions post vaccine: Marland Kitchen Difficulty breathing  . Swelling of face and throat  . A fast heartbeat  . A bad rash all over body  . Dizziness and weakness   Immunizations Administered    Name Date Dose VIS Date Route   Pfizer COVID-19 Vaccine 08/27/2019 10:31 AM 0.3 mL 05/27/2019 Intramuscular   Manufacturer: [place]   Lot: [id number]   [place]: [id number]

## 2019-09-05 ENCOUNTER — Telehealth: Payer: Self-pay | Admitting: Medical

## 2019-09-05 DIAGNOSIS — E559 Vitamin D deficiency, unspecified: Secondary | ICD-10-CM

## 2019-09-05 NOTE — Telephone Encounter (Signed)
Review of labs , prescription for vitamin D. No voicemail.

## 2019-09-05 NOTE — Telephone Encounter (Signed)
Reviewed labs with patient again. Initially with her physical. Communicated she needed higher dose of Vitamin D,  She is at  9.4 so sent to h er pharmacy  Vit D 50,000u /once per week, x 6 weeks then recheck of Vit D levels. Patient verbalizes understanding and has no questions at the end of our conversation.

## 2019-09-20 ENCOUNTER — Ambulatory Visit: Payer: BC Managed Care – PPO | Attending: Internal Medicine

## 2019-09-20 DIAGNOSIS — Z23 Encounter for immunization: Secondary | ICD-10-CM

## 2019-09-20 NOTE — Progress Notes (Signed)
   Covid-19 Vaccination Clinic  Name:  Cindy Yang    MRN: [id number] DOB: 1955-10-20  09/20/2019  [name] was observed post Covid-19 immunization for 15 minutes without incident. She was provided with Vaccine Information Sheet and instruction to access the V-Safe system.   [name] was instructed to call 911 with any severe reactions post vaccine: Marland Kitchen Difficulty breathing  . Swelling of face and throat  . A fast heartbeat  . A bad rash all over body  . Dizziness and weakness   Immunizations Administered    Name Date Dose VIS Date Route   Pfizer COVID-19 Vaccine 09/20/2019  1:09 PM 0.3 mL 05/27/2019 Intramuscular   Manufacturer: [place]   Lot: [id number]   [place]: [id number]

## 2019-10-26 ENCOUNTER — Other Ambulatory Visit: Payer: Self-pay | Admitting: Medical

## 2019-10-26 ENCOUNTER — Telehealth: Payer: Self-pay | Admitting: *Deleted

## 2019-10-26 DIAGNOSIS — E559 Vitamin D deficiency, unspecified: Secondary | ICD-10-CM

## 2019-10-26 NOTE — Progress Notes (Signed)
Rechecking Vitamin D after supplementing with  50,000IU/week x  6 weeks.  D level  Was 9.4 ng/ml.

## 2019-10-26 NOTE — Telephone Encounter (Signed)
Cindy Yang called [facility] stating she has completed her Vit D and wants to know if she needs labs for a recheck. Confirmed with her, yes, she is due for her 3 month labs and we will schedule that today. She also states she has a rash on both arms that started yesterday with unknown cause, + itching. Per H.Ratcliffe PA-C advised pt to stay on her Zyrtec to help with itching and apply 1% Hydrocortisone cream to rash BID for 1 week, if no better by Monday, call and come in for an office visit. If the rash spreads or gets significantly worse call to be seen. Further advised to stay cool to help with irritation and itching, especially bathing, not to use hot water, only warm enough to be comfortable to help with symptoms. Cindy Yang verbalizes understanding of topics discussed and has no further needs or concerns at this time. Lab appt scheduled.

## 2019-10-27 ENCOUNTER — Other Ambulatory Visit: Payer: BC Managed Care – PPO

## 2019-10-27 ENCOUNTER — Other Ambulatory Visit: Payer: Self-pay

## 2019-10-27 DIAGNOSIS — E559 Vitamin D deficiency, unspecified: Secondary | ICD-10-CM

## 2019-10-27 NOTE — Progress Notes (Unsigned)
MA drawing labs this am and asked provider to come into the area she was drawing blood because the patient wanted someone to see a rash. Advised patient that I would be glad to see her but she would need to make an appt to be seen and pt reports that she's spoken to her PCP and was advised to follow certain tx. Encouraged to do what she was advised to do and if that wasn't effective to f/u with PCP as encouraged and she verbalized understanding.

## 2019-10-28 LAB — VITAMIN D 25 HYDROXY (VIT D DEFICIENCY, FRACTURES): Vit D, 25-Hydroxy: 29.2 ng/mL — ABNORMAL LOW (ref 30.0–100.0)

## 2020-03-27 ENCOUNTER — Other Ambulatory Visit: Payer: Self-pay | Admitting: Medical

## 2020-03-27 DIAGNOSIS — Z1231 Encounter for screening mammogram for malignant neoplasm of breast: Secondary | ICD-10-CM

## 2020-03-28 ENCOUNTER — Ambulatory Visit
Admission: RE | Admit: 2020-03-28 | Discharge: 2020-03-28 | Disposition: A | Discharge status: Home / Self Care | Payer: BC Managed Care – PPO | Source: Ambulatory Visit | Attending: Medical | Admitting: Medical

## 2020-03-28 ENCOUNTER — Other Ambulatory Visit: Payer: Self-pay

## 2020-03-28 DIAGNOSIS — Z1231 Encounter for screening mammogram for malignant neoplasm of breast: Secondary | ICD-10-CM | POA: Diagnosis not present

## 2020-06-27 IMAGING — CT CT CHEST LUNG CANCER SCREENING LOW DOSE W/O CM
2 of 5 series · 15 of 40 positions shown, 18 images · non-contrast
Comparison: None.

CLINICAL DATA: Current smoker, 40 pack-year history.

EXAM:
CT CHEST WITHOUT CONTRAST LOW-DOSE FOR LUNG CANCER SCREENING
TECHNIQUE: Multidetector CT imaging of the chest was performed following the
standard protocol without IV contrast.

[Series 4: lung 1.00 br44 cor · coronal · 0.64mm/px · 3 of 259 slices shown]
[im 52/259  lung]
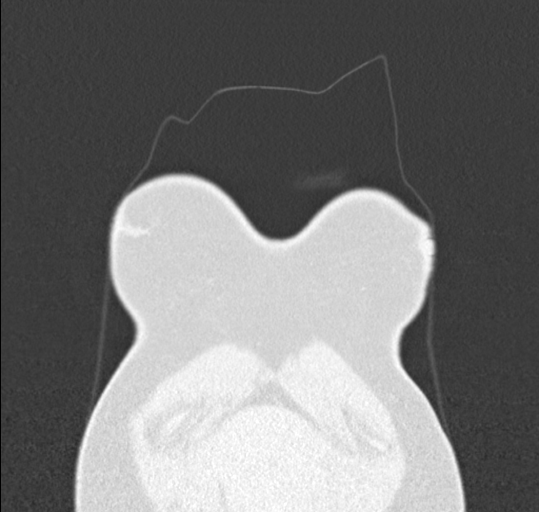
[im 104/259  lung]
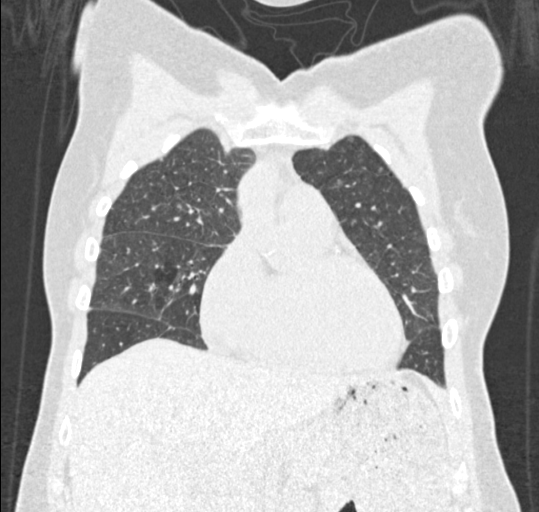
[im 155/259  lung]
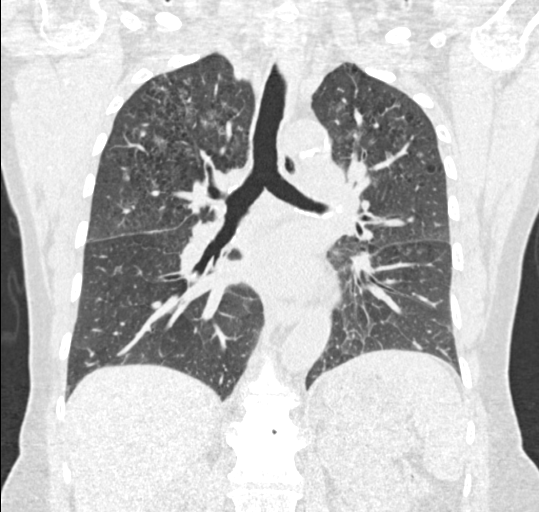

[Series 9: lung 1.00 br60 axial · axial · 0.67mm/px · z∈[-1077,-782]mm · 12 of 325 slices shown, 15 images]
[im 15/325  mediastinal]
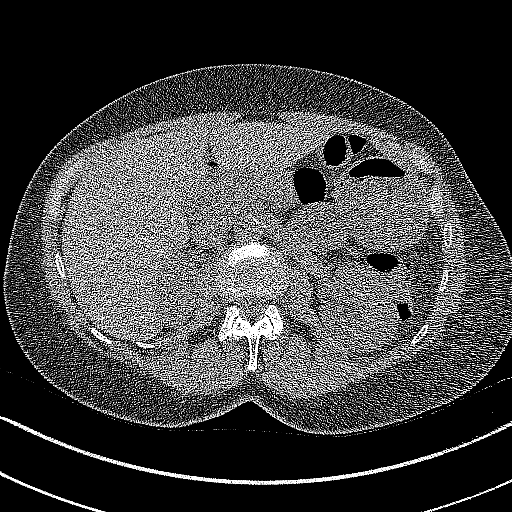
[im 15/325  lung]
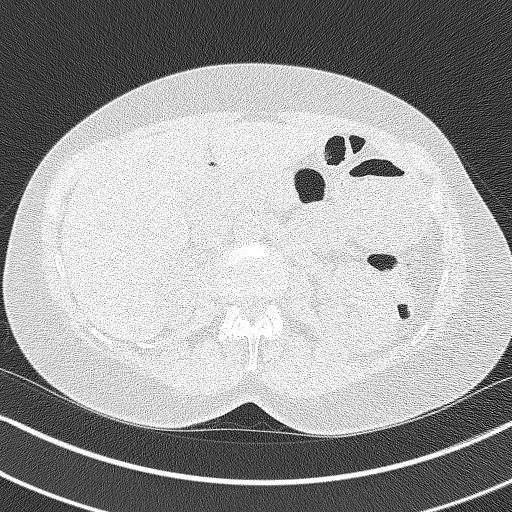
[im 45/325  lung]
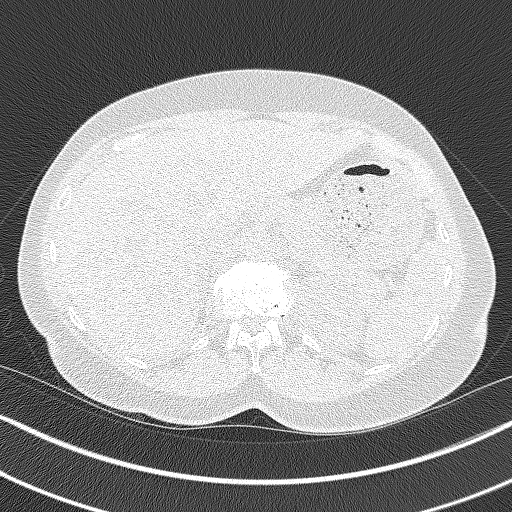
[im 74/325  lung]
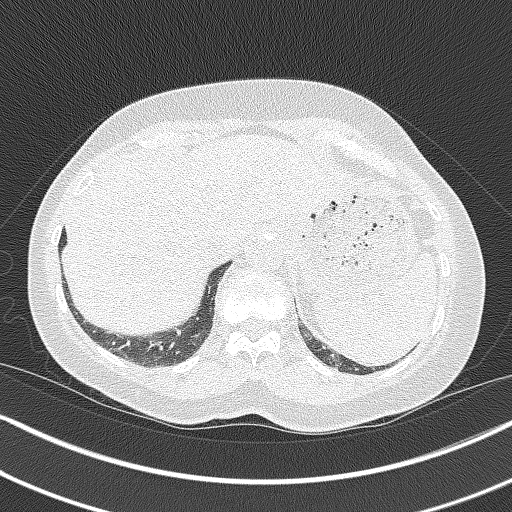
[im 104/325  lung]
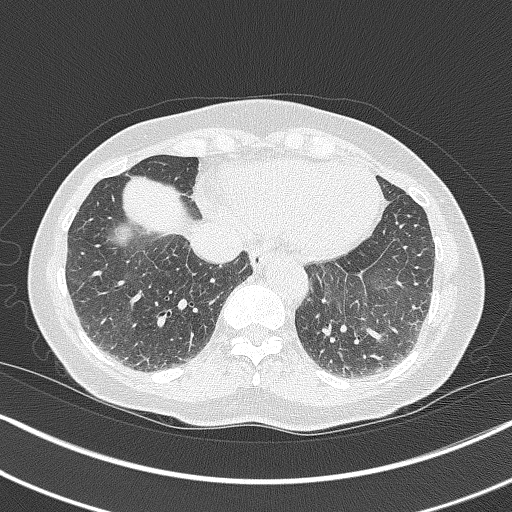
[im 118/325  mediastinal]
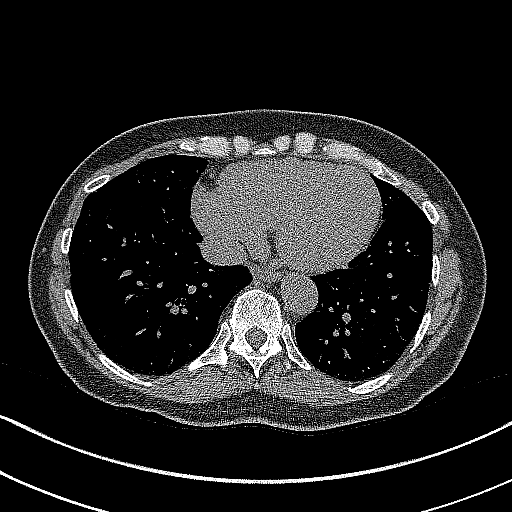
[im 118/325  lung]
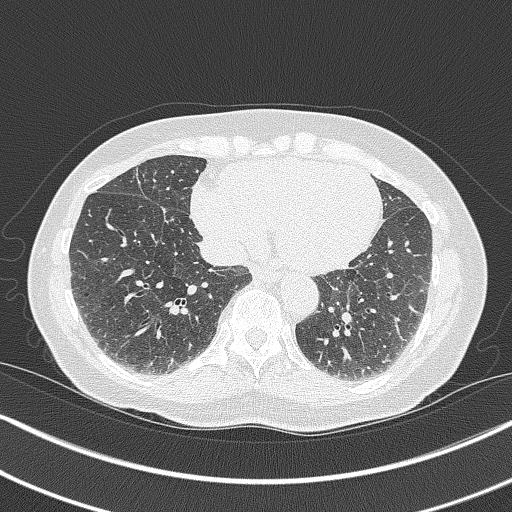
[im 148/325  lung]
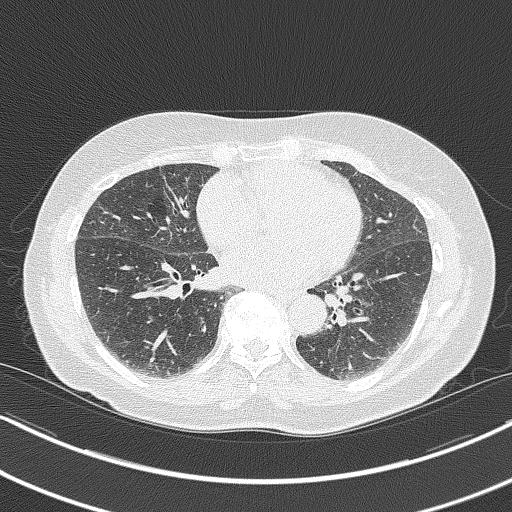
[im 177/325  lung]
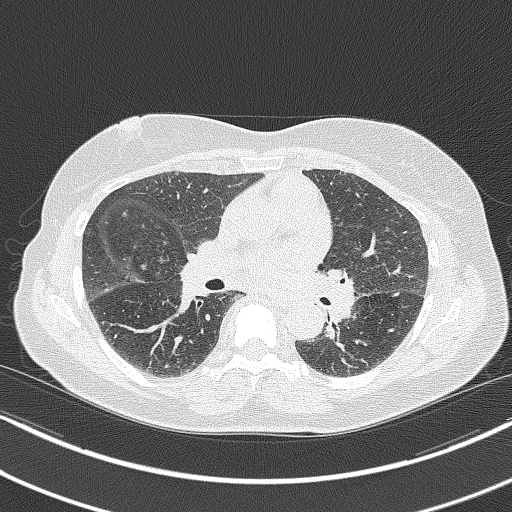
[im 207/325  lung]
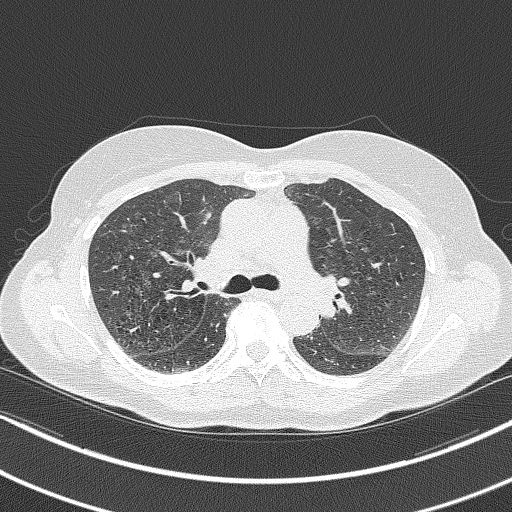
[im 221/325  mediastinal]
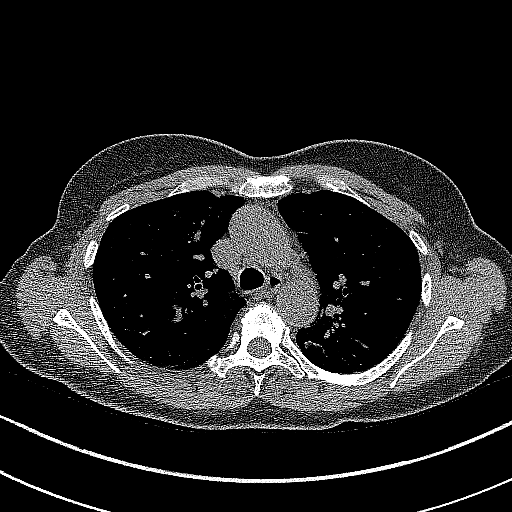
[im 221/325  lung]
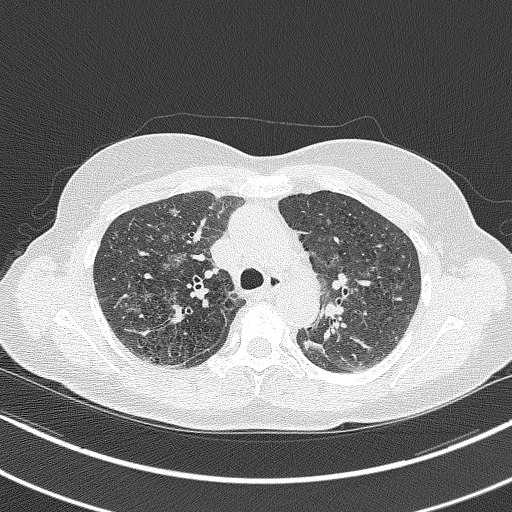
[im 251/325  lung]
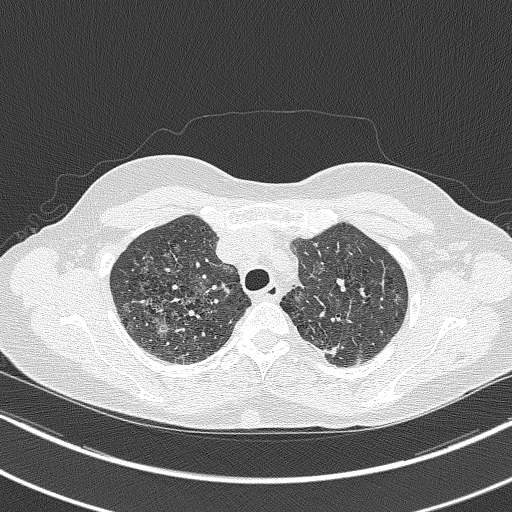
[im 280/325  lung]
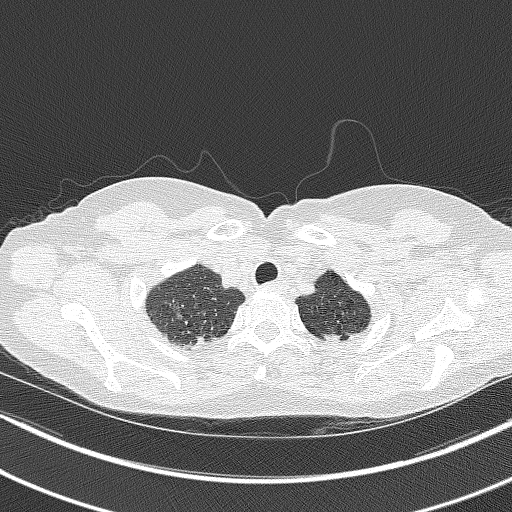
[im 310/325  lung]
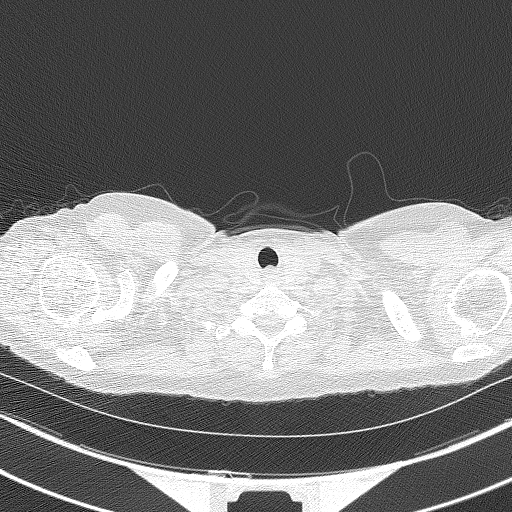

[15 of 40 positions shown; findings below may reference images not displayed]

FINDINGS: Cardiovascular: Atherosclerotic calcification of the aorta and
coronary arteries. Heart size normal. No pericardial effusion.

Mediastinum/Nodes: No pathologically enlarged mediastinal or
axillary lymph nodes. Hilar regions are difficult to definitively
evaluate without IV contrast. Prepericardiac lymph nodes are not
enlarged by CT size criteria. Esophagus is grossly unremarkable.

Lungs/Pleura: Biapical pleuroparenchymal scarring. Centrilobular and
paraseptal emphysema. Upper and midlung zone predominant
peribronchovascular nodular ground-glass. Left lower lobe nodule
measures 8.7 mm (9/106). Additional bilateral pulmonary nodules
measure 4.6 mm or less in size. No pleural fluid. Airway is
unremarkable.

Upper Abdomen: Visualized portions of the liver, adrenal glands,
kidneys, spleen, pancreas, stomach and bowel are grossly
unremarkable.

Musculoskeletal: Degenerative changes in the spine. No worrisome
lytic or sclerotic lesions.
IMPRESSION: 1. 8.7 mm left lower lobe nodule. Lung-RADS 4A, suspicious. Follow
up low-dose chest CT without contrast in 3 months (please use the
following order, "CT CHEST LCS NODULE FOLLOW-UP W/O CM") is
recommended. Alternatively, PET may be considered when there is a
solid component 8mm or larger. These results will be called to the
ordering clinician or representative by the Radiologist Assistant,
and communication documented in the PACS or zVision Dashboard.
2. Upper and midlung zone predominant peribronchovascular nodular
ground-glass can be seen with a viral/atypical pneumonia.
Respiratory bronchiolitis/interstitial lung disease not excluded.
3. Aortic atherosclerosis (3AOGE-170.0). Coronary artery
calcification.
4.  Emphysema (3AOGE-STR.D).

## 2020-10-08 ENCOUNTER — Other Ambulatory Visit: Payer: Self-pay | Admitting: Medical

## 2020-10-08 DIAGNOSIS — Z0000 Encounter for general adult medical examination without abnormal findings: Secondary | ICD-10-CM

## 2020-10-08 NOTE — Progress Notes (Signed)
Orders for physical exam.

## 2020-10-09 ENCOUNTER — Other Ambulatory Visit: Payer: Self-pay

## 2020-10-09 ENCOUNTER — Other Ambulatory Visit: Payer: BC Managed Care – PPO

## 2020-10-09 DIAGNOSIS — Z0000 Encounter for general adult medical examination without abnormal findings: Secondary | ICD-10-CM

## 2020-10-09 LAB — POCT URINALYSIS DIPSTICK
Bilirubin, UA: NEGATIVE
Blood, UA: NEGATIVE
Glucose, UA: NEGATIVE
Ketones, UA: NEGATIVE
Leukocytes, UA: NEGATIVE
Nitrite, UA: NEGATIVE
Protein, UA: NEGATIVE
Spec Grav, UA: 1.01 (ref 1.010–1.025)
Urobilinogen, UA: 0.2 E.U./dL
pH, UA: 5 (ref 5.0–8.0)

## 2020-10-09 NOTE — Progress Notes (Signed)
Please review and comment on EKG. Cindy Yang

## 2020-10-10 LAB — CMP12+LP+TP+TSH+6AC+CBC/D/PLT
ALT: 13 IU/L (ref 0–32)
AST: 16 IU/L (ref 0–40)
Albumin/Globulin Ratio: 1.6 (ref 1.2–2.2)
Albumin: 4.2 g/dL (ref 3.8–4.8)
Alkaline Phosphatase: 96 IU/L (ref 44–121)
BUN/Creatinine Ratio: 16 (ref 12–28)
BUN: 12 mg/dL (ref 8–27)
Basophils Absolute: 0 10*3/uL (ref 0.0–0.2)
Basos: 1 %
Bilirubin Total: 0.4 mg/dL (ref 0.0–1.2)
Calcium: 9.2 mg/dL (ref 8.7–10.3)
Chloride: 104 mmol/L (ref 96–106)
Chol/HDL Ratio: 3.4 ratio (ref 0.0–4.4)
Cholesterol, Total: 189 mg/dL (ref 100–199)
Creatinine, Ser: 0.73 mg/dL (ref 0.57–1.00)
EOS (ABSOLUTE): 0.1 10*3/uL (ref 0.0–0.4)
Eos: 2 %
Estimated CHD Risk: 0.5 times avg. (ref 0.0–1.0)
Free Thyroxine Index: 3.4 (ref 1.2–4.9)
GGT: 15 IU/L (ref 0–60)
Globulin, Total: 2.6 g/dL (ref 1.5–4.5)
Glucose: 96 mg/dL (ref 65–99)
HDL: 56 mg/dL (ref >39)
Hematocrit: 39.1 % (ref 34.0–46.6)
Hemoglobin: 13.9 g/dL (ref 11.1–15.9)
Immature Grans (Abs): 0 10*3/uL (ref 0.0–0.1)
Immature Granulocytes: 0 %
Iron: 94 ug/dL (ref 27–139)
LDH: 231 IU/L — ABNORMAL HIGH (ref 119–226)
LDL Chol Calc (NIH): 118 mg/dL — ABNORMAL HIGH (ref 0–99)
Lymphocytes Absolute: 2.7 10*3/uL (ref 0.7–3.1)
Lymphs: 43 %
MCH: 29.1 pg (ref 26.6–33.0)
MCHC: 35.5 g/dL (ref 31.5–35.7)
MCV: 82 fL (ref 79–97)
Monocytes Absolute: 0.5 10*3/uL (ref 0.1–0.9)
Monocytes: 8 %
Neutrophils Absolute: 2.8 10*3/uL (ref 1.4–7.0)
Neutrophils: 46 %
Phosphorus: 3.8 mg/dL (ref 3.0–4.3)
Platelets: 214 10*3/uL (ref 150–450)
Potassium: 4.1 mmol/L (ref 3.5–5.2)
RBC: 4.78 x10E6/uL (ref 3.77–5.28)
RDW: 15.2 % (ref 11.7–15.4)
Sodium: 141 mmol/L (ref 134–144)
T3 Uptake Ratio: 37 % (ref 24–39)
T4, Total: 9.2 ug/dL (ref 4.5–12.0)
TSH: 1.46 u[IU]/mL (ref 0.450–4.500)
Total Protein: 6.8 g/dL (ref 6.0–8.5)
Triglycerides: 83 mg/dL (ref 0–149)
Uric Acid: 7.4 mg/dL — ABNORMAL HIGH (ref 3.0–7.2)
VLDL Cholesterol Cal: 15 mg/dL (ref 5–40)
WBC: 6.2 10*3/uL (ref 3.4–10.8)
eGFR: 91 mL/min/{1.73_m2} (ref >59)

## 2020-10-10 LAB — HGB A1C W/O EAG: Hgb A1c MFr Bld: 4.7 % — ABNORMAL LOW (ref 4.8–5.6)

## 2020-10-10 LAB — VITAMIN D 25 HYDROXY (VIT D DEFICIENCY, FRACTURES): Vit D, 25-Hydroxy: 16.9 ng/mL — ABNORMAL LOW (ref 30.0–100.0)

## 2020-10-10 NOTE — Progress Notes (Signed)
Please review and comment . Ty Jacci Ruberg

## 2020-10-16 ENCOUNTER — Encounter: Payer: Self-pay | Admitting: Medical

## 2020-10-16 ENCOUNTER — Other Ambulatory Visit: Payer: Self-pay

## 2020-10-16 ENCOUNTER — Ambulatory Visit: Payer: BC Managed Care – PPO | Admitting: Medical

## 2020-10-16 VITALS — BP 126/82 | HR 83 | Temp 98.2°F | Resp 18 | Ht 66.54 in | Wt 214.6 lb

## 2020-10-16 DIAGNOSIS — E559 Vitamin D deficiency, unspecified: Secondary | ICD-10-CM

## 2020-10-16 DIAGNOSIS — E7800 Pure hypercholesterolemia, unspecified: Secondary | ICD-10-CM

## 2020-10-16 DIAGNOSIS — Z122 Encounter for screening for malignant neoplasm of respiratory organs: Secondary | ICD-10-CM

## 2020-10-16 DIAGNOSIS — Z0000 Encounter for general adult medical examination without abnormal findings: Secondary | ICD-10-CM

## 2020-10-16 DIAGNOSIS — Z9189 Other specified personal risk factors, not elsewhere classified: Secondary | ICD-10-CM

## 2020-10-16 DIAGNOSIS — F17200 Nicotine dependence, unspecified, uncomplicated: Secondary | ICD-10-CM

## 2020-10-16 NOTE — Progress Notes (Signed)
Subjective:    Patient ID: Cindy Yang, female    DOB: [birth year], 65 y.o.   MRN: [id number]  HPI 65 yo female in non acute distress here for annual physical. 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 No complaints today.  Blood pressure 126/82, pulse 83, temperature 98.2 F (36.8 C), temperature source Oral, resp. rate 18, height 5' 6.54" (1.69 m), weight 214 lb 9.6 oz (97.3 kg), SpO2 95 %.  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   Procedure: TOTAL HIP ARTHROPLASTY ANTERIOR APPROACH;  Surgeon: Hessie Knows, MD;  Location: [facility] ORS;  Service: Orthopedics;  Laterality: Left;   TUBAL LIGATION       Family History  Problem Relation Age of Onset   Dementia Mother    Lung cancer Mother    Lung cancer Father    Valvular heart disease Father    Hypertension Brother    Breast cancer Maternal Aunt     Social History   Socioeconomic History   Marital status: Married    Spouse name: Not on file   Number of children: Not on file   Years of education: Not on file   Highest education level: Not on file  Occupational History   Not on file  Tobacco Use   Smoking status: Former    Packs/day: 1.00    Years: 40.00    Pack years: 40.00    Types: Cigarettes    Start date: 09/25/1973    Quit date: 10/17/2018    Years since quitting: 2.2   Smokeless tobacco: Never  Vaping Use   Vaping Use: Never used  Substance and Sexual Activity   Alcohol use: No   Drug use: No   Sexual activity: Yes  Other Topics Concern   Not on file  Social History Narrative   Not  on file   Social Determinants of Health   Financial Resource Strain: Not on file  Food Insecurity: Not on file  Transportation Needs: Not on file  Physical Activity: Not on file  Stress: Not on file  Social Connections: Not on file  Intimate Partner Violence: Not on file     Review of Systems  Constitutional: Negative.   HENT: Negative.   Eyes: Negative.   Respiratory: Negative.   Cardiovascular: Negative.   Gastrointestinal: Negative.   Genitourinary: Negative.   Musculoskeletal: Negative.   Skin: Positive for rash.       3-4 months itchy, no pain. No discharge.  Allergic/Immunologic: Positive for environmental allergies. Negative for food allergies and immunocompromised state.  Neurological: Negative.   Hematological: Negative for adenopathy. Does not bruise/bleed easily.  Psychiatric/Behavioral: Negative.    Quit  2 years ago smoke previously 45 years  One pack a day.  [place] and boosted 1 time  No family history of osteoporosis  mOther died Dementia,  Father Lung (smoker).  Brothers x 3 Younger brother HTN,  Oldest is estranged Closer brother pending hip replacement unknown side.    Objective:   Physical Exam Vitals and nursing note reviewed.  Constitutional:  Appearance: Normal appearance.  HENT:     Head: Normocephalic and atraumatic.     Jaw: There is normal jaw occlusion.     Right Ear: Hearing, tympanic membrane, ear canal and external ear normal.     Left Ear: Hearing, tympanic membrane, ear canal and external ear normal.     Mouth/Throat:     Mouth: Mucous membranes are moist.     Pharynx: Oropharynx is clear.  Eyes:     General: Lids are normal. Vision grossly intact.     Extraocular Movements: Extraocular movements intact.     Conjunctiva/sclera: Conjunctivae normal.     Pupils: Pupils are equal, round, and reactive to light.     Funduscopic exam:    Right eye: Red reflex present.        Left eye: Red reflex  present. Neck:     Thyroid: No thyroid mass, thyromegaly or thyroid tenderness.     Trachea: Trachea normal.  Cardiovascular:     Rate and Rhythm: Normal rate and regular rhythm.     Pulses:          Radial pulses are 2+ on the right side and 2+ on the left side.       Popliteal pulses are 2+ on the right side and 2+ on the left side.       Dorsalis pedis pulses are 2+ on the right side and 2+ on the left side.       Posterior tibial pulses are 2+ on the right side and 2+ on the left side.     Heart sounds: Normal heart sounds, S1 normal and S2 normal.  Pulmonary:     Effort: Pulmonary effort is normal.     Breath sounds: Normal breath sounds.  Chest:  Breasts:    Right: No supraclavicular adenopathy.     Left: No supraclavicular adenopathy.  Abdominal:     General: Abdomen is flat. Bowel sounds are normal.     Palpations: Abdomen is soft.  Musculoskeletal:        General: Normal range of motion.     Cervical back: Normal range of motion and neck supple.     Right foot: Normal range of motion. No deformity, bunion, Charcot foot, foot drop or prominent metatarsal heads.     Left foot: Normal range of motion. No deformity, bunion, Charcot foot, foot drop or prominent metatarsal heads.  Feet:     Right foot:     Skin integrity: Skin integrity normal.     Left foot:     Skin integrity: Skin integrity normal.  Lymphadenopathy:     Head:     Right side of head: No submandibular, tonsillar, preauricular, posterior auricular or occipital adenopathy.     Left side of head: No submental, submandibular, tonsillar, preauricular, posterior auricular or occipital adenopathy.     Cervical: No cervical adenopathy.     Right cervical: No superficial, deep or posterior cervical adenopathy.    Left cervical: No superficial, deep or posterior cervical adenopathy.     Upper Body:     Right upper body: No supraclavicular or epitrochlear adenopathy.     Left upper body: No supraclavicular or  epitrochlear adenopathy.  Skin:    General: Skin is warm and dry.     Capillary Refill: Capillary refill takes less than 2 seconds.  Neurological:     General: No focal deficit present.     Mental Status: She is alert and oriented to person, place, and  time. Mental status is at baseline.     GCS: GCS eye subscore is 4. GCS verbal subscore is 5. GCS motor subscore is 6.     Cranial Nerves: Cranial nerves are intact.     Sensory: Sensation is intact.     Motor: Motor function is intact.     Coordination: Coordination is intact.     Gait: Gait is intact.     Deep Tendon Reflexes:     Reflex Scores:      Brachioradialis reflexes are 2+ on the right side and 2+ on the left side.      Patellar reflexes are 2+ on the right side and 2+ on the left side.      Achilles reflexes are 2+ on the right side and 2+ on the left side. Psychiatric:        Mood and Affect: Mood normal.        Behavior: Behavior normal. Behavior is cooperative.        Thought Content: Thought content normal.        Judgment: Judgment normal.          Assessment & Plan:  Annual physical exam Vit6 D deficienty, Start taking Vit D3  2000IU/day Elevated LDL will monitor discussed with Dr. Rosanna Randy. CT vs Lung low dose smoker 48 pack years. Patient going to set up Pap smear with [facility] Will repeat fasting labs ,lipid panel and Vit D level in  6 months.  ( November 3rd 8 am) Patient verbalizes understanding and has no questions at discharge.

## 2020-10-16 NOTE — Patient Instructions (Signed)
Bone Density Test A bone density test uses a type of X-ray to measure the amount of calcium and other minerals in a person's bones. It can measure bone density in the hip and the spine. The test is similar to having a regular X-ray. This test may also be called:  Bone densitometry.  Bone mineral density test.  Dual-energy X-ray absorptiometry (DEXA). You may have this test to:  Diagnose a condition that causes weak or thin bones (osteoporosis).  Screen you for osteoporosis.  Predict your risk for a broken bone (fracture).  Determine how well your osteoporosis treatment is working. Tell a health care provider about:  Any allergies you have.  All medicines you are taking, including vitamins, herbs, eye drops, creams, and over-the-counter medicines.  Any problems you or family members have had with anesthetic medicines.  Any blood disorders you have.  Any surgeries you have had.  Any medical conditions you have.  Whether you are pregnant or may be pregnant.  Any medical tests you have had within the past 14 days that used contrast material. What are the risks? Generally, this is a safe test. However, it does expose you to a small amount of radiation, which can slightly increase your cancer risk. What happens before the test?  Do not take any calcium supplements within the 24 hours before your test.  You will need to remove all metal jewelry, eyeglasses, removable dental appliances, and any other metal objects on your body. What happens during the test?  You will lie down on an exam table. There will be an X-ray generator below you and an imaging device above you.  Other devices, such as boxes or braces, may be used to position your body properly for the scan.  The machine will slowly scan your body. You will need to keep very still while the machine does the scan.  The images will show up on a screen in the room. Images will be examined by a specialist after your test is  finished. The procedure may vary among health care providers and hospitals.   What can I expect after the test? It is up to you to get the results of your test. Ask your health care provider, or the department that is doing the test, when your results will be ready. Summary  A bone density test is an imaging test that uses a type of X-ray to measure the amount of calcium and other minerals in your bones.  The test may be used to diagnose or screen you for a condition that causes weak or thin bones (osteoporosis), predict your risk for a broken bone (fracture), or determine how well your osteoporosis treatment is working.  Do not take any calcium supplements within 24 hours before your test.  Ask your health care provider, or the department that is doing the test, when your results will be ready. This information is not intended to replace advice given to you by your health care provider. Make sure you discuss any questions you have with your health care provider. Document Revised: 11/17/2019 Document Reviewed: 11/17/2019 Elsevier Patient Education  2021 [place] [employer] Calories are units of energy. Your body needs a certain number of calories from food to keep going throughout the day. When you eat or drink more calories than your body needs, your body stores the extra calories mostly as fat. When you eat or drink fewer calories than your body needs, your body burns fat to get  the energy it needs. Calorie counting means keeping track of how many calories you eat and drink each day. Calorie counting can be helpful if you need to lose weight. If you eat fewer calories than your body needs, you should lose weight. Ask your health care provider what a healthy weight is for you. For calorie counting to work, you will need to eat the right number of calories each day to lose a healthy amount of weight per week. A dietitian can help you figure out how many calories you  need in a day and will suggest ways to reach your calorie goal.  A healthy amount of weight to lose each week is usually 1-2 lb (0.5-0.9 kg). This usually means that your daily calorie intake should be reduced by 500-750 calories.  Eating 1,200-1,500 calories a day can help most women lose weight.  Eating 1,500-1,800 calories a day can help most men lose weight. What do I need to know about calorie counting? Work with your health care provider or dietitian to determine how many calories you should get each day. To meet your daily calorie goal, you will need to:  Find out how many calories are in each food that you would like to eat. Try to do this before you eat.  Decide how much of the food you plan to eat.  Keep a food log. Do this by writing down what you ate and how many calories it had. To successfully lose weight, it is important to balance calorie counting with a healthy lifestyle that includes regular activity. Where do I find calorie information? The number of calories in a food can be found on a Nutrition Facts label. If a food does not have a Nutrition Facts label, try to look up the calories online or ask your dietitian for help. Remember that calories are listed per serving. If you choose to have more than one serving of a food, you will have to multiply the calories per serving by the number of servings you plan to eat. For example, the label on a package of bread might say that a serving size is 1 slice and that there are 90 calories in a serving. If you eat 1 slice, you will have eaten 90 calories. If you eat 2 slices, you will have eaten 180 calories.   How do I keep a food log? After each time that you eat, record the following in your food log as soon as possible:  What you ate. Be sure to include toppings, sauces, and other extras on the food.  How much you ate. This can be measured in cups, ounces, or number of items.  How many calories were in each food and  drink.  The total number of calories in the food you ate. Keep your food log near you, such as in a pocket-sized notebook or on an app or website on your mobile phone. Some programs will calculate calories for you and show you how many calories you have left to meet your daily goal. What are some portion-control tips?  Know how many calories are in a serving. This will help you know how many servings you can have of a certain food.  Use a measuring cup to measure serving sizes. You could also try weighing out portions on a kitchen scale. With time, you will be able to estimate serving sizes for some foods.  Take time to put servings of different foods on your favorite plates or in your  favorite bowls and cups so you know what a serving looks like.  Try not to eat straight from a food's packaging, such as from a bag or box. Eating straight from the package makes it hard to see how much you are eating and can lead to overeating. Put the amount you would like to eat in a cup or on a plate to make sure you are eating the right portion.  Use smaller plates, glasses, and bowls for smaller portions and to prevent overeating.  Try not to multitask. For example, avoid watching TV or using your computer while eating. If it is time to eat, sit down at a table and enjoy your food. This will help you recognize when you are full. It will also help you be more mindful of what and how much you are eating. What are tips for following this plan? Reading food labels  Check the calorie count compared with the serving size. The serving size may be smaller than what you are used to eating.  Check the source of the calories. Try to choose foods that are high in protein, fiber, and vitamins, and low in saturated fat, trans fat, and sodium. Shopping  Read nutrition labels while you shop. This will help you make healthy decisions about which foods to buy.  Pay attention to nutrition labels for low-fat or fat-free  foods. These foods sometimes have the same number of calories or more calories than the full-fat versions. They also often have added sugar, starch, or salt to make up for flavor that was removed with the fat.  Make a grocery list of lower-calorie foods and stick to it. Cooking  Try to cook your favorite foods in a healthier way. For example, try baking instead of frying.  Use low-fat dairy products. Meal planning  Use more fruits and vegetables. One-half of your plate should be fruits and vegetables.  Include lean proteins, such as chicken, [place], and fish. Lifestyle Each week, aim to do one of the following:  150 minutes of moderate exercise, such as walking.  75 minutes of vigorous exercise, such as running. General information  Know how many calories are in the foods you eat most often. This will help you calculate calorie counts faster.  Find a way of tracking calories that works for you. Get creative. Try different apps or programs if writing down calories does not work for you. What foods should I eat?  Eat nutritious foods. It is better to have a nutritious, high-calorie food, such as an avocado, than a food with few nutrients, such as a bag of potato chips.  Use your calories on foods and drinks that will fill you up and will not leave you hungry soon after eating. ? Examples of foods that fill you up are nuts and nut butters, vegetables, lean proteins, and high-fiber foods such as whole grains. High-fiber foods are foods with more than 5 g of fiber per serving.  Pay attention to calories in drinks. Low-calorie drinks include water and unsweetened drinks. The items listed above may not be a complete list of foods and beverages you can eat. Contact a dietitian for more information.   What foods should I limit? Limit foods or drinks that are not good sources of vitamins, minerals, or protein or that are high in unhealthy fats. These include:  Candy.  Other  sweets.  Sodas, specialty coffee drinks, alcohol, and juice. The items listed above may not be a complete list of foods and  beverages you should avoid. Contact a dietitian for more information. How do I count calories when eating out?  Pay attention to portions. Often, portions are much larger when eating out. Try these tips to keep portions smaller: ? Consider sharing a meal instead of getting your own. ? If you get your own meal, eat only half of it. Before you start eating, ask for a container and put half of your meal into it. ? When available, consider ordering smaller portions from the menu instead of full portions.  Pay attention to your food and drink choices. Knowing the way food is cooked and what is included with the meal can help you eat fewer calories. ? If calories are listed on the menu, choose the lower-calorie options. ? Choose dishes that include vegetables, fruits, whole grains, low-fat dairy products, and lean proteins. ? Choose items that are boiled, broiled, grilled, or steamed. Avoid items that are buttered, battered, fried, or served with cream sauce. Items labeled as crispy are usually fried, unless stated otherwise. ? Choose water, low-fat milk, unsweetened iced tea, or other drinks without added sugar. If you want an alcoholic beverage, choose a lower-calorie option, such as a glass of wine or light beer. ? Ask for dressings, sauces, and syrups on the side. These are usually high in calories, so you should limit the amount you eat. ? If you want a salad, choose a garden salad and ask for grilled meats. Avoid extra toppings such as bacon, cheese, or fried items. Ask for the dressing on the side, or ask for olive oil and vinegar or lemon to use as dressing.  Estimate how many servings of a food you are given. Knowing serving sizes will help you be aware of how much food you are eating at restaurants. Where to find more information  Centers for Disease Control and  Prevention: http://www.wolf.info/  [place] Department of Agriculture: http://www.wilson-mendoza.org/ Summary  Calorie counting means keeping track of how many calories you eat and drink each day. If you eat fewer calories than your body needs, you should lose weight.  A healthy amount of weight to lose per week is usually 1-2 lb (0.5-0.9 kg). This usually means reducing your daily calorie intake by 500-750 calories.  The number of calories in a food can be found on a Nutrition Facts label. If a food does not have a Nutrition Facts label, try to look up the calories online or ask your dietitian for help.  Use smaller plates, glasses, and bowls for smaller portions and to prevent overeating.  Use your calories on foods and drinks that will fill you up and not leave you hungry shortly after a meal. This information is not intended to replace advice given to you by your health care provider. Make sure you discuss any questions you have with your health care provider. Document Revised: 07/14/2019 Document Reviewed: 07/14/2019 Elsevier Patient Education  2021 [employer].

## 2021-03-13 ENCOUNTER — Other Ambulatory Visit: Payer: Self-pay

## 2021-03-13 ENCOUNTER — Ambulatory Visit: Payer: BC Managed Care – PPO

## 2021-03-13 DIAGNOSIS — Z23 Encounter for immunization: Secondary | ICD-10-CM

## 2021-04-01 ENCOUNTER — Other Ambulatory Visit: Payer: Self-pay | Admitting: Medical

## 2021-04-01 DIAGNOSIS — Z139 Encounter for screening, unspecified: Secondary | ICD-10-CM

## 2021-04-02 ENCOUNTER — Other Ambulatory Visit: Payer: Self-pay

## 2021-04-02 ENCOUNTER — Ambulatory Visit
Admission: RE | Admit: 2021-04-02 | Discharge: 2021-04-02 | Disposition: A | Discharge status: Home / Self Care | Payer: BC Managed Care – PPO | Source: Ambulatory Visit | Attending: Medical | Admitting: Medical

## 2021-04-02 DIAGNOSIS — Z1231 Encounter for screening mammogram for malignant neoplasm of breast: Secondary | ICD-10-CM | POA: Diagnosis not present

## 2021-04-02 DIAGNOSIS — Z139 Encounter for screening, unspecified: Secondary | ICD-10-CM

## 2021-08-13 ENCOUNTER — Other Ambulatory Visit: Payer: Self-pay | Admitting: Medical

## 2021-08-13 ENCOUNTER — Other Ambulatory Visit: Payer: BC Managed Care – PPO

## 2021-08-13 DIAGNOSIS — Z0000 Encounter for general adult medical examination without abnormal findings: Secondary | ICD-10-CM

## 2021-08-13 NOTE — Progress Notes (Signed)
Annual labs ordered.

## 2021-08-14 ENCOUNTER — Other Ambulatory Visit: Payer: BC Managed Care – PPO

## 2021-08-14 ENCOUNTER — Other Ambulatory Visit: Payer: Self-pay

## 2021-08-14 DIAGNOSIS — Z0000 Encounter for general adult medical examination without abnormal findings: Secondary | ICD-10-CM

## 2021-08-14 DIAGNOSIS — E559 Vitamin D deficiency, unspecified: Secondary | ICD-10-CM

## 2021-08-15 LAB — CMP12+LP+TP+TSH+6AC+CBC/D/PLT
ALT: 16 IU/L (ref 0–32)
AST: 18 IU/L (ref 0–40)
Albumin/Globulin Ratio: 1.6 (ref 1.2–2.2)
Albumin: 4.1 g/dL (ref 3.8–4.8)
Alkaline Phosphatase: 96 IU/L (ref 44–121)
BUN/Creatinine Ratio: 18 (ref 12–28)
BUN: 13 mg/dL (ref 8–27)
Basophils Absolute: 0.1 10*3/uL (ref 0.0–0.2)
Basos: 1 %
Bilirubin Total: 0.4 mg/dL (ref 0.0–1.2)
Calcium: 9.4 mg/dL (ref 8.7–10.3)
Chloride: 108 mmol/L — ABNORMAL HIGH (ref 96–106)
Chol/HDL Ratio: 3.7 ratio (ref 0.0–4.4)
Cholesterol, Total: 189 mg/dL (ref 100–199)
Creatinine, Ser: 0.71 mg/dL (ref 0.57–1.00)
EOS (ABSOLUTE): 0.1 10*3/uL (ref 0.0–0.4)
Eos: 1 %
Estimated CHD Risk: 0.7 times avg. (ref 0.0–1.0)
Free Thyroxine Index: 4 (ref 1.2–4.9)
GGT: 15 IU/L (ref 0–60)
Globulin, Total: 2.5 g/dL (ref 1.5–4.5)
Glucose: 97 mg/dL (ref 70–99)
HDL: 51 mg/dL (ref >39)
Hematocrit: 41.6 % (ref 34.0–46.6)
Hemoglobin: 14.2 g/dL (ref 11.1–15.9)
Immature Grans (Abs): 0 10*3/uL (ref 0.0–0.1)
Immature Granulocytes: 0 %
Iron: 84 ug/dL (ref 27–139)
LDH: 221 IU/L (ref 119–226)
LDL Chol Calc (NIH): 121 mg/dL — ABNORMAL HIGH (ref 0–99)
Lymphocytes Absolute: 2.4 10*3/uL (ref 0.7–3.1)
Lymphs: 42 %
MCH: 27.4 pg (ref 26.6–33.0)
MCHC: 34.1 g/dL (ref 31.5–35.7)
MCV: 80 fL (ref 79–97)
Monocytes Absolute: 0.4 10*3/uL (ref 0.1–0.9)
Monocytes: 8 %
Neutrophils Absolute: 2.7 10*3/uL (ref 1.4–7.0)
Neutrophils: 48 %
Phosphorus: 3.6 mg/dL (ref 3.0–4.3)
Platelets: 262 10*3/uL (ref 150–450)
Potassium: 4.1 mmol/L (ref 3.5–5.2)
RBC: 5.18 x10E6/uL (ref 3.77–5.28)
RDW: 14.5 % (ref 11.7–15.4)
Sodium: 145 mmol/L — ABNORMAL HIGH (ref 134–144)
T3 Uptake Ratio: 43 % — ABNORMAL HIGH (ref 24–39)
T4, Total: 9.3 ug/dL (ref 4.5–12.0)
TSH: 0.683 u[IU]/mL (ref 0.450–4.500)
Total Protein: 6.6 g/dL (ref 6.0–8.5)
Triglycerides: 91 mg/dL (ref 0–149)
Uric Acid: 7.3 mg/dL — ABNORMAL HIGH (ref 3.0–7.2)
VLDL Cholesterol Cal: 17 mg/dL (ref 5–40)
WBC: 5.6 10*3/uL (ref 3.4–10.8)
eGFR: 94 mL/min/{1.73_m2} (ref >59)

## 2021-08-15 LAB — HGB A1C W/O EAG: Hgb A1c MFr Bld: 4.9 % (ref 4.8–5.6)

## 2021-08-15 LAB — VITAMIN D 25 HYDROXY (VIT D DEFICIENCY, FRACTURES): Vit D, 25-Hydroxy: 11.8 ng/mL — ABNORMAL LOW (ref 30.0–100.0)

## 2021-10-16 ENCOUNTER — Ambulatory Visit: Payer: BC Managed Care – PPO | Admitting: Medical

## 2021-10-16 ENCOUNTER — Encounter: Payer: Self-pay | Admitting: Medical

## 2021-10-16 VITALS — BP 122/76 | HR 78 | Temp 97.8°F | Resp 16

## 2021-10-16 DIAGNOSIS — E559 Vitamin D deficiency, unspecified: Secondary | ICD-10-CM

## 2021-10-16 DIAGNOSIS — Z0000 Encounter for general adult medical examination without abnormal findings: Secondary | ICD-10-CM

## 2021-10-16 LAB — POCT URINALYSIS DIPSTICK
Bilirubin, UA: NEGATIVE
Blood, UA: NEGATIVE
Glucose, UA: NEGATIVE
Ketones, UA: NEGATIVE
Nitrite, UA: NEGATIVE
Protein, UA: NEGATIVE
Spec Grav, UA: 1.025 (ref 1.010–1.025)
Urobilinogen, UA: 0.2 E.U./dL
pH, UA: 5 (ref 5.0–8.0)

## 2021-10-16 NOTE — Progress Notes (Signed)
Subjective:    Patient ID: Cindy Yang, female    DOB: [birth year], 66 y.o.   MRN: [id number]  HPI 66 yo female in non acute distress , here for physical exam. No complaints to day. Quit smoking 3 yrs ago.  Blood pressure 122/76, pulse 78, temperature 97.8 F (36.6 C), temperature source Tympanic, resp. rate 16, SpO2 95 %.   Review of Systems  All other systems reviewed and are negative.  No Known Allergies   Past Medical History:  Diagnosis Date   Dyspnea    History of prediabetes 10/18/2015   Shoulder tendonitis, left 10/07/2016    Past Surgical History:  Procedure Laterality Date   APPENDECTOMY     COLONOSCOPY WITH PROPOFOL N/A 05/09/2019   Procedure: COLONOSCOPY WITH PROPOFOL;  Surgeon: Lin Landsman, MD;  Location: [facility] ENDOSCOPY;  Service: Gastroenterology;  Laterality: N/A;   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   Procedure: TOTAL HIP ARTHROPLASTY ANTERIOR APPROACH;  Surgeon: Hessie Knows, MD;  Location: [facility] ORS;  Service: Orthopedics;  Laterality: Left;   TUBAL LIGATION      Family History  Problem Relation Age of Onset   Dementia Mother    Lung cancer Mother    Lung cancer Father    Valvular heart disease Father    Hypertension Brother    Breast cancer Maternal Aunt     Social History   Socioeconomic History   Marital status: Married    Spouse name: Not on file   Number of children: Not on file   Years of education: Not on file   Highest education level: Not on file  Occupational History   Not on file  Tobacco Use   Smoking status: Former    Packs/day: 1.00    Years: 40.00    Pack years: 40.00    Types: Cigarettes    Start date: 09/25/1973    Quit date: 10/17/2018    Years since quitting: 3.0   Smokeless tobacco: Never  Vaping Use   Vaping Use: Never used  Substance and Sexual Activity   Alcohol use: No   Drug use: No   Sexual activity: Yes  Other Topics Concern   Not on file  Social History Narrative   Not on file   Social  Determinants of Health   Financial Resource Strain: Not on file  Food Insecurity: Not on file  Transportation Needs: Not on file  Physical Activity: Not on file  Stress: Not on file  Social Connections: Not on file  Intimate Partner Violence: Not on file         Objective:   Physical Exam Vitals and nursing note reviewed.  Constitutional:      Appearance: Normal appearance.  HENT:     Head: Normocephalic and atraumatic.     Right Ear: Tympanic membrane, ear canal and external ear normal.     Left Ear: Tympanic membrane, ear canal and external ear normal.     Nose: Nose normal.     Mouth/Throat:     Mouth: Mucous membranes are moist.     Pharynx: Oropharynx is clear.  Eyes:     Extraocular Movements: Extraocular movements intact.     Conjunctiva/sclera: Conjunctivae normal.     Pupils: Pupils are equal, round, and reactive to light.  Cardiovascular:     Rate and Rhythm: Normal rate and regular rhythm.  Pulmonary:     Effort: Pulmonary effort is normal.     Breath sounds: Normal breath sounds.  Genitourinary:  Rectum: Guaiac result negative.  Musculoskeletal:        General: Normal range of motion.     Cervical back: Normal range of motion and neck supple.  Skin:    General: Skin is warm and dry.     Capillary Refill: Capillary refill takes less than 2 seconds.  Neurological:     General: No focal deficit present.     Mental Status: She is alert and oriented to person, place, and time. Mental status is at baseline.  Psychiatric:        Mood and Affect: Mood normal.        Behavior: Behavior normal.        Thought Content: Thought content normal.        Judgment: Judgment normal.     Recent Results (from the past 2160 hour(s))  Hgb A1c w/o eAG     Status: None   Collection Time: 08/14/21  8:19 AM  Result Value Ref Range   Hgb A1c MFr Bld 4.9 4.8 - 5.6 %    Comment:          Prediabetes: 5.7 - 6.4          Diabetes: >6.4          Glycemic control for adults  with diabetes: <7.0   CMP12+LP+TP+TSH+6AC+CBC/D/Plt     Status: Abnormal   Collection Time: 08/14/21  8:19 AM  Result Value Ref Range   Glucose 97 70 - 99 mg/dL   Uric Acid 7.3 (H) 3.0 - 7.2 mg/dL    Comment:            Therapeutic target for gout patients: <6.0   BUN 13 8 - 27 mg/dL   Creatinine, Ser 0.71 0.57 - 1.00 mg/dL   eGFR 94 >59 mL/min/1.73   BUN/Creatinine Ratio 18 12 - 28   Sodium 145 (H) 134 - 144 mmol/L   Potassium 4.1 3.5 - 5.2 mmol/L   Chloride 108 (H) 96 - 106 mmol/L   Calcium 9.4 8.7 - 10.3 mg/dL   Phosphorus 3.6 3.0 - 4.3 mg/dL   Total Protein 6.6 6.0 - 8.5 g/dL   Albumin 4.1 3.8 - 4.8 g/dL   Globulin, Total 2.5 1.5 - 4.5 g/dL   Albumin/Globulin Ratio 1.6 1.2 - 2.2   Bilirubin Total 0.4 0.0 - 1.2 mg/dL   Alkaline Phosphatase 96 44 - 121 IU/L   LDH 221 119 - 226 IU/L   AST 18 0 - 40 IU/L   ALT 16 0 - 32 IU/L   GGT 15 0 - 60 IU/L   Iron 84 27 - 139 ug/dL   Cholesterol, Total 189 100 - 199 mg/dL   Triglycerides 91 0 - 149 mg/dL   HDL 51 >39 mg/dL   VLDL Cholesterol Cal 17 5 - 40 mg/dL   LDL Chol Calc (NIH) 121 (H) 0 - 99 mg/dL   Chol/HDL Ratio 3.7 0.0 - 4.4 ratio    Comment:                                   T. Chol/HDL Ratio                                             Men  Women  1/2 Avg.Risk  3.4    3.3                                   Avg.Risk  5.0    4.4                                2X Avg.Risk  9.6    7.1                                3X Avg.Risk 23.4   11.0    Estimated CHD Risk 0.7 0.0 - 1.0 times avg.    Comment: The CHD Risk is based on the T. Chol/HDL ratio. Other factors affect CHD Risk such as hypertension, smoking, diabetes, severe obesity, and family history of premature CHD.    TSH 0.683 0.450 - 4.500 uIU/mL   T4, Total 9.3 4.5 - 12.0 ug/dL   T3 Uptake Ratio 43 (H) 24 - 39 %   Free Thyroxine Index 4.0 1.2 - 4.9   WBC 5.6 3.4 - 10.8 x10E3/uL   RBC 5.18 3.77 - 5.28 x10E6/uL   Hemoglobin 14.2 11.1 - 15.9  g/dL   Hematocrit 41.6 34.0 - 46.6 %   MCV 80 79 - 97 fL   MCH 27.4 26.6 - 33.0 pg   MCHC 34.1 31.5 - 35.7 g/dL   RDW 14.5 11.7 - 15.4 %   Platelets 262 150 - 450 x10E3/uL   Neutrophils 48 Not Estab. %   Lymphs 42 Not Estab. %   Monocytes 8 Not Estab. %   Eos 1 Not Estab. %   Basos 1 Not Estab. %   Neutrophils Absolute 2.7 1.4 - 7.0 x10E3/uL   Lymphocytes Absolute 2.4 0.7 - 3.1 x10E3/uL   Monocytes Absolute 0.4 0.1 - 0.9 x10E3/uL   EOS (ABSOLUTE) 0.1 0.0 - 0.4 x10E3/uL   Basophils Absolute 0.1 0.0 - 0.2 x10E3/uL   Immature Granulocytes 0 Not Estab. %   Immature Grans (Abs) 0.0 0.0 - 0.1 x10E3/uL  VITAMIN D 25 Hydroxy (Vit-D Deficiency, Fractures)     Status: Abnormal   Collection Time: 08/14/21  8:19 AM  Result Value Ref Range   Vit D, 25-Hydroxy 11.8 (L) 30.0 - 100.0 ng/mL    Comment: Vitamin D deficiency has been defined by the Institute of Medicine and an Endocrine Society practice guideline as a level of serum 25-OH vitamin D less than 20 ng/mL (1,2). The Endocrine Society went on to further define vitamin D insufficiency as a level between 21 and 29 ng/mL (2). 1. IOM (Institute of Medicine). 2010. Dietary reference    intakes for calcium and D. [place]: The    [employer]. 2. Holick MF, Binkley [place], Bischoff-Ferrari HA, et al.    Evaluation, treatment, and prevention of vitamin D    deficiency: an Endocrine Society clinical practice    guideline. JCEM. 2011 Jul; 96(7):1911-30.         Assessment & Plan:  Annual physical exam. Bone scan ordered Low dose CT already ordered from last year. High dose  vit D x 6 weeks then recheck levels. Recommend shingles and Pneumonia vaccinations to get at local pharmacy.patient verbalizes understanding and has no questions at discharge. Orders Placed This Encounter  Procedures   DG Bone Density  Standing Status:   Future    Standing Expiration Date:   10/17/2022    Order Specific Question:   Reason for Exam  (SYMPTOM  OR DIAGNOSIS REQUIRED)    Answer:   bone density    Order Specific Question:   Preferred imaging location?    Answer:   [facility]    Order Specific Question:   Release to patient    Answer:   Immediate   POCT urinalysis dipstick    Patient verbalizes understanding and has no questions at [place].

## 2021-10-30 MED ORDER — VITAMIN D (ERGOCALCIFEROL) 1.25 MG (50000 UNIT) PO CAPS: 50000.0000 [IU] | ORAL_CAPSULE | ORAL | 0 refills | Status: DC

## 2021-10-31 ENCOUNTER — Encounter: Payer: Self-pay | Admitting: Medical

## 2021-12-19 ENCOUNTER — Encounter: Payer: Self-pay | Admitting: Adult Health

## 2021-12-19 ENCOUNTER — Ambulatory Visit: Payer: BC Managed Care – PPO | Admitting: Adult Health

## 2021-12-19 VITALS — BP 124/72 | HR 93 | Temp 97.3°F | Resp 96

## 2021-12-19 DIAGNOSIS — U071 COVID-19: Secondary | ICD-10-CM

## 2021-12-19 LAB — POC COVID19 BINAXNOW: SARS Coronavirus 2 Ag: POSITIVE — AB

## 2021-12-19 NOTE — Progress Notes (Signed)
[facility] [address], [place] [address]   Office Visit Note  Patient Name: Cindy Yang Date of Birth [id number]  Medical Record number [id number]  Date of Service: 12/19/2021  Chief Complaint  Patient presents with   Covid Exposure    Husband is in hosp with COVID     HPI Pt is here for a sick visit. She reports two coworkers were sick last week.  Four days ago she herself began to feel bad.  She describes cold symptoms, like cough, PND and fatigue.  She denies sob, chest pain, NVD or fever.  Her husband tested positive for Covid 2 days ago and is in the hospital, doing well at this time.  She is vaccinated for Covid.    Current Medication:  Outpatient Encounter Medications as of 12/19/2021  Medication Sig   Cetirizine HCl (ZYRTEC ALLERGY PO) Take 10 mg by mouth daily.   fluticasone (FLONASE) 50 MCG/ACT nasal spray Place 2 sprays into both nostrils daily.   albuterol (VENTOLIN HFA) 108 (90 Base) MCG/ACT inhaler Inhale 2 puffs into the lungs every 6 (six) hours as needed for wheezing or shortness of breath. (Patient not taking: Reported on 12/19/2021)   tiotropium (SPIRIVA HANDIHALER) 18 MCG inhalation capsule Place 1 capsule (18 mcg total) into inhaler and inhale daily. (Patient not taking: Reported on 10/16/2020)   varenicline (CHANTIX STARTING MONTH PAK) 0.5 MG X 11 & 1 MG X 42 tablet Take one 0.5 mg tablet by mouth once daily for 3 days, then increase to one 0.5 mg tablet twice daily for 4 days, then increase to one 1 mg tablet twice daily. (Patient not taking: Reported on 12/19/2021)   Vitamin D, Ergocalciferol, (DRISDOL) 1.25 MG (50000 UNIT) CAPS capsule Take 1 capsule (50,000 Units total) by mouth every 7 (seven) days. (Patient not taking: Reported on 12/19/2021)   No facility-administered encounter medications on file as of 12/19/2021.      Medical History: Past Medical History:  Diagnosis Date   Dyspnea    History of prediabetes 10/18/2015   Shoulder  tendonitis, left 10/07/2016     Vital Signs: BP 124/72   Pulse 93   Temp (!) 97.3 F (36.3 C) (Tympanic)   Resp (!) 96    Review of Systems  Constitutional:  Positive for fatigue and unexpected weight change. Negative for fever.  HENT:  Positive for postnasal drip and sinus pressure.   Respiratory:  Positive for cough.     Physical Exam Constitutional:      Appearance: Normal appearance.  HENT:     Head: Normocephalic.     Nose: Nose normal.     Mouth/Throat:     Mouth: Mucous membranes are moist.     Pharynx: Oropharynx is clear.  Cardiovascular:     Rate and Rhythm: Normal rate.  Pulmonary:     Effort: Pulmonary effort is normal. No respiratory distress.     Breath sounds: Normal breath sounds. No stridor. No wheezing or rhonchi.  Neurological:     Mental Status: She is alert.    Assessment/Plan: 1. COVID-19 Rest, Drink plenty of fluids.  Use cough drops, and any other over the counter symptom management medications as discussed in visit.  Message or return to clinic for ongoing symptoms.  If you have chest pain, shortness of breath or any other severe symptoms please seek help at the Emergency department.  - POC COVID-19     General Counseling: Nacole verbalizes understanding of the findings  of todays visit and agrees with plan of treatment. I have discussed any further diagnostic evaluation that may be needed or ordered today. We also reviewed her medications today. she has been encouraged to call the office with any questions or concerns that should arise related to todays visit.   Orders Placed This Encounter  Procedures   POC COVID-19    No orders of the defined types were placed in this encounter.   Time spent:25 Minutes    Kendell Bane AGNP-C Nurse Practitioner

## 2021-12-25 ENCOUNTER — Encounter: Payer: Self-pay | Admitting: Medical

## 2022-01-29 ENCOUNTER — Ambulatory Visit (INDEPENDENT_AMBULATORY_CARE_PROVIDER_SITE_OTHER): Payer: Self-pay | Admitting: Nurse Practitioner

## 2022-01-29 ENCOUNTER — Other Ambulatory Visit: Payer: Self-pay

## 2022-01-29 ENCOUNTER — Encounter: Payer: Self-pay | Admitting: Nurse Practitioner

## 2022-01-29 ENCOUNTER — Telehealth: Payer: Self-pay

## 2022-01-29 VITALS — BP 120/72 | HR 82 | Temp 98.4°F | Ht 67.5 in | Wt 217.6 lb

## 2022-01-29 DIAGNOSIS — E559 Vitamin D deficiency, unspecified: Secondary | ICD-10-CM

## 2022-01-29 DIAGNOSIS — Z87891 Personal history of nicotine dependence: Secondary | ICD-10-CM

## 2022-01-29 DIAGNOSIS — Z1211 Encounter for screening for malignant neoplasm of colon: Secondary | ICD-10-CM

## 2022-01-29 DIAGNOSIS — Z86010 Personal history of colonic polyps: Secondary | ICD-10-CM

## 2022-01-29 DIAGNOSIS — Z1231 Encounter for screening mammogram for malignant neoplasm of breast: Secondary | ICD-10-CM

## 2022-01-29 DIAGNOSIS — Z23 Encounter for immunization: Secondary | ICD-10-CM

## 2022-01-29 MED ORDER — NA SULFATE-K SULFATE-MG SULF 17.5-3.13-1.6 GM/177ML PO SOLN: 1.0000 | Freq: Once | ORAL | 0 refills | Status: AC

## 2022-01-29 NOTE — Patient Instructions (Addendum)
   Mammogram: Call to schedule at [facility] Address: [address] [place], [place] [address] Phone: [phone]  Bone Density Scan and CT screening of lungs both at [facility] outpatient imaging call scheduling to set up appointments [phone]  Colonoscopy: call to schedule  Due in November 2023 Address: [address], [place],  [address] Phone: [phone]  Needed vaccines Shingles and Pneumonia  Available at pharmacy (order sent to [facility])  Recommend flu vaccine this fall.  Call [facility] in September for appointment  [phone]   Returning for labs in November prior to appointment with new PCP

## 2022-01-29 NOTE — Telephone Encounter (Signed)
Gastroenterology Pre-Procedure Review  Request Date: 05/15/22 Requesting Physician: Dr. Marius Ditch  PATIENT REVIEW QUESTIONS: The patient responded to the following health history questions as indicated:    1. Are you having any GI issues? no 2. Do you have a personal history of Polyps? Yes last colonoscopy performed by Dr. Marius Ditch 05/09/19  3. Do you have a family history of Colon Cancer or Polyps? no 4. Diabetes Mellitus? no 5. Joint replacements in the past 12 months?no 6. Major health problems in the past 3 months?no 7. Any artificial heart valves, MVP, or defibrillator?no    MEDICATIONS & ALLERGIES:    Patient reports the following regarding taking any anticoagulation/antiplatelet therapy:   Plavix, Coumadin, Eliquis, Xarelto, Lovenox, Pradaxa, Brilinta, or Effient? no Aspirin? no  Patient confirms/reports the following medications:  Current Outpatient Medications  Medication Sig Dispense Refill   albuterol (VENTOLIN HFA) 108 (90 Base) MCG/ACT inhaler Inhale 2 puffs into the lungs every 6 (six) hours as needed for wheezing or shortness of breath. (Patient not taking: Reported on 12/19/2021)     Cetirizine HCl (ZYRTEC ALLERGY PO) Take 10 mg by mouth daily.     fluticasone (FLONASE) 50 MCG/ACT nasal spray Place 2 sprays into both nostrils daily.     tiotropium (SPIRIVA HANDIHALER) 18 MCG inhalation capsule Place 1 capsule (18 mcg total) into inhaler and inhale daily. (Patient not taking: Reported on 10/16/2020) 30 capsule 12   varenicline (CHANTIX STARTING MONTH PAK) 0.5 MG X 11 & 1 MG X 42 tablet Take one 0.5 mg tablet by mouth once daily for 3 days, then increase to one 0.5 mg tablet twice daily for 4 days, then increase to one 1 mg tablet twice daily. (Patient not taking: Reported on 12/19/2021) 53 tablet 0   Vitamin D, Ergocalciferol, (DRISDOL) 1.25 MG (50000 UNIT) CAPS capsule Take 1 capsule (50,000 Units total) by mouth every 7 (seven) days. (Patient not taking: Reported on 12/19/2021) 5  capsule 0   No current facility-administered medications for this visit.    Patient confirms/reports the following allergies:  No Known Allergies  No orders of the defined types were placed in this encounter.   AUTHORIZATION INFORMATION Primary Insurance: 1D#: Group #:  Secondary Insurance: 1D#: Group #:  SCHEDULE INFORMATION: Date: 05/15/22 Time: Location: [place]

## 2022-01-29 NOTE — Progress Notes (Unsigned)
   Subjective:    Patient ID: Cindy Yang, female    DOB: 12/03/1955, 66 y.o.   MRN: [id number]  HPI 66 year old female presenting for medication reconciliation and assistance with establishing PCP.  Most recent PCP Daryll Drown no longer with [facility] [place].   Physical and labs performed 10/2021  Prescription Vitamin D started in May of this year she took for 6 weeks and has completed that course   COVID + 12/19/2021  Today's Vitals   01/29/22 0855  BP: 120/72  Pulse: 82  Temp: 98.4 F (36.9 C)  TempSrc: Tympanic  SpO2: 97%  Weight: 217 lb 9.6 oz (98.7 kg)  Height: 5' 7.5" (1.715 m)   Body mass index is 33.58 kg/m.   Review of Systems  Constitutional: Negative.   HENT: Negative.    Respiratory: Negative.    Cardiovascular: Negative.   Genitourinary: Negative.   Neurological: Negative.        Objective:   Physical Exam HENT:     Head: Normocephalic.  Pulmonary:     Effort: Pulmonary effort is normal.  Neurological:     General: No focal deficit present.     Mental Status: She is alert.  Psychiatric:        Mood and Affect: Mood normal.     Recent Results (from the past 2160 hour(s))  POC COVID-19     Status: Abnormal   Collection Time: 12/19/21  8:38 AM  Result Value Ref Range   SARS Coronavirus 2 Ag Positive (A) Negative  VITAMIN D 25 Hydroxy (Vit-D Deficiency, Fractures)     Status: Abnormal   Collection Time: 01/29/22  9:32 AM  Result Value Ref Range   Vit D, 25-Hydroxy 13.7 (L) 30.0 - 100.0 ng/mL    Comment: Vitamin D deficiency has been defined by the [place] practice guideline as a level of serum 25-OH vitamin D less than 20 ng/mL (1,2). The Endocrine Society went on to further define vitamin D insufficiency as a level between 21 and 29 ng/mL (2). 1. IOM (Institute of Medicine). 2010. Dietary reference    intakes for calcium and D. [place]: The    [employer]. 2. Holick MF, Binkley [place], Bischoff-Ferrari HA, et al.    Evaluation, treatment, and prevention of vitamin D    deficiency: an Endocrine Society clinical practice    guideline. JCEM. 2011 Jul; 96(7):1911-30.          Assessment & Plan:   Needs:   COLONOSCOPY (Pts 45-67yr Insurance coverage will need to be confirmed) (Every 3 Years)  Next due on 05/08/2022  Mammogram 03/2022 Bone density ordered  Low dose CT annual screen overdue most recent 07/2019 Possible Cardiology referral for noted aortic atherosclerosis  Vaccines needed: shingles, COVID booster, pneumonia  Annual flu 02/2022  Physical with new PCP scheduled for December 1st. She will return to clinic for labs prior  Will restart Vitamin D for 8 weeks and recheck after course completion with other labs  Meds ordered this encounter  Medications   Vitamin D, Ergocalciferol, (DRISDOL) 1.25 MG (50000 UNIT) CAPS capsule    Sig: Take 1 capsule (50,000 Units total) by mouth every 7 (seven) days for 8 doses.    Dispense:  8 capsule    Refill:  0

## 2022-01-30 LAB — VITAMIN D 25 HYDROXY (VIT D DEFICIENCY, FRACTURES): Vit D, 25-Hydroxy: 13.7 ng/mL — ABNORMAL LOW (ref 30.0–100.0)

## 2022-01-30 MED ORDER — VITAMIN D (ERGOCALCIFEROL) 1.25 MG (50000 UNIT) PO CAPS: 50000.0000 [IU] | ORAL_CAPSULE | ORAL | 0 refills | Status: AC

## 2022-03-24 ENCOUNTER — Ambulatory Visit (INDEPENDENT_AMBULATORY_CARE_PROVIDER_SITE_OTHER): Payer: Self-pay

## 2022-03-24 DIAGNOSIS — Z23 Encounter for immunization: Secondary | ICD-10-CM

## 2022-04-01 ENCOUNTER — Ambulatory Visit
Admission: RE | Admit: 2022-04-01 | Discharge: 2022-04-01 | Disposition: A | Discharge status: Home / Self Care | Payer: BC Managed Care – PPO | Source: Ambulatory Visit | Attending: Nurse Practitioner | Admitting: Nurse Practitioner

## 2022-04-01 DIAGNOSIS — Z1231 Encounter for screening mammogram for malignant neoplasm of breast: Secondary | ICD-10-CM

## 2022-04-25 ENCOUNTER — Telehealth: Payer: Self-pay

## 2022-04-25 NOTE — Telephone Encounter (Signed)
Patient contacted office to inquire on her colonoscopy time.  Informed her that her colonoscopy time is given the day before her procedure by calling to the endoscopy dept.  I will reprint her instructions for her as well.   Thanks,  [place], [place]

## 2022-05-14 ENCOUNTER — Encounter: Payer: Self-pay | Admitting: Gastroenterology

## 2022-05-15 ENCOUNTER — Encounter
Admission: RE | Disposition: A | Discharge status: Home / Self Care | Payer: Self-pay | Source: Home / Self Care | Attending: Gastroenterology

## 2022-05-15 ENCOUNTER — Encounter: Payer: Self-pay | Admitting: Gastroenterology

## 2022-05-15 ENCOUNTER — Ambulatory Visit
Admission: RE | Admit: 2022-05-15 | Discharge: 2022-05-15 | Disposition: A | Discharge status: Home / Self Care | Payer: BC Managed Care – PPO | Attending: Gastroenterology | Admitting: Gastroenterology

## 2022-05-15 ENCOUNTER — Ambulatory Visit: Payer: BC Managed Care – PPO | Admitting: Anesthesiology

## 2022-05-15 DIAGNOSIS — D126 Benign neoplasm of colon, unspecified: Secondary | ICD-10-CM | POA: Diagnosis not present

## 2022-05-15 DIAGNOSIS — Z860100 Personal history of colon polyps, unspecified: Secondary | ICD-10-CM

## 2022-05-15 DIAGNOSIS — K635 Polyp of colon: Secondary | ICD-10-CM | POA: Diagnosis not present

## 2022-05-15 DIAGNOSIS — Z1211 Encounter for screening for malignant neoplasm of colon: Secondary | ICD-10-CM | POA: Diagnosis not present

## 2022-05-15 DIAGNOSIS — Z6833 Body mass index (BMI) 33.0-33.9, adult: Secondary | ICD-10-CM | POA: Diagnosis not present

## 2022-05-15 DIAGNOSIS — E669 Obesity, unspecified: Secondary | ICD-10-CM | POA: Diagnosis not present

## 2022-05-15 HISTORY — PX: COLONOSCOPY WITH PROPOFOL: SHX5780

## 2022-05-15 SURGERY — COLONOSCOPY WITH PROPOFOL
Anesthesia: General

## 2022-05-15 MED ORDER — PROPOFOL 10 MG/ML IV BOLUS
INTRAVENOUS | Status: DC | PRN
  Administered 2022-05-15: 30 mg via INTRAVENOUS
  Administered 2022-05-15: 70 mg via INTRAVENOUS

## 2022-05-15 MED ORDER — PROPOFOL 500 MG/50ML IV EMUL
INTRAVENOUS | Status: DC | PRN
  Administered 2022-05-15: 120 ug/kg/min via INTRAVENOUS

## 2022-05-15 MED ORDER — SODIUM CHLORIDE 0.9 % IV SOLN: INTRAVENOUS | Status: DC

## 2022-05-15 MED ORDER — LIDOCAINE 2% (20 MG/ML) 5 ML SYRINGE
INTRAMUSCULAR | Status: DC | PRN
  Administered 2022-05-15: 20 mg via INTRAVENOUS

## 2022-05-15 NOTE — H&P (Signed)
Cephas Darby, MD [address]  [place]  [place], [place] [address]  Main: [phone]  Fax: [phone] Pager: [phone]  Primary Care Physician:  Apolonio Schneiders, FNP Primary Gastroenterologist:  Dr. Cephas Darby  Pre-Procedure History & Physical: HPI:  Cindy Yang is a 66 y.o. female is here for an colonoscopy.   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 Authorizing Provider  albuterol (VENTOLIN HFA) 108 (90 Base) MCG/ACT inhaler Inhale 2 puffs into the lungs every 6 (six) hours as needed for wheezing or shortness of breath. Patient not taking: Reported on 12/19/2021    [provider]  Cetirizine HCl (ZYRTEC ALLERGY PO) Take 10 mg by mouth daily.    [provider]  fluticasone (FLONASE) 50 MCG/ACT nasal spray Place 2 sprays into both nostrils daily.    [provider]  tiotropium (SPIRIVA HANDIHALER) 18 MCG inhalation capsule Place 1 capsule (18 mcg total) into inhaler and inhale daily. Patient not taking: Reported on 10/16/2020 05/20/19   Noemi Chapel P, DO  varenicline (CHANTIX STARTING MONTH PAK) 0.5 MG X 11 & 1 MG X 42 tablet Take one 0.5 mg tablet by mouth once daily for 3 days, then increase to one 0.5 mg tablet twice daily for 4 days, then increase to one 1 mg tablet twice daily. Patient not taking: Reported on  12/19/2021 04/18/19   Daryll Drown R, PA-C    Allergies as of 01/29/2022   (No Known Allergies)    Family History  Problem Relation Age of Onset   Dementia Mother    Lung cancer Mother    Lung cancer Father    Valvular heart disease Father    Hypertension Brother    Breast cancer Maternal Aunt     Social History   Socioeconomic History   Marital status: Married    Spouse name: Not on file   Number of children: Not on file   Years of education: Not on file   Highest education level: Not on file  Occupational History   Not on file  Tobacco Use   Smoking status: Former    Packs/day: 1.00    Years: 40.00    Total pack years: 40.00    Types: Cigarettes    Start date: 09/25/1973    Quit date: 10/17/2018    Years since quitting: 3.5   Smokeless tobacco: Never  Vaping Use   Vaping Use: Never used  Substance and Sexual Activity   Alcohol use: No   Drug use: No   Sexual activity: Yes  Other Topics Concern   Not on file  Social History Narrative   Not on file   Social Determinants of Health   Financial Resource Strain: Not on file  Food Insecurity: Not on file  Transportation Needs: Not on file  Physical Activity: Not on file  Stress:  Not on file  Social Connections: Not on file  Intimate Partner Violence: Not on file    Review of Systems: See HPI, otherwise negative ROS  Physical Exam: BP 133/72   Pulse 79   Temp (!) 95.6 F (35.3 C) (Temporal)   Resp 19   Ht 5' 7.5" (1.715 m)   Wt 99.8 kg   SpO2 100%   BMI 33.95 kg/m  General:   Alert,  pleasant and cooperative in NAD Head:  Normocephalic and atraumatic. Neck:  Supple; no masses or thyromegaly. Lungs:  Clear throughout to auscultation.    Heart:  Regular rate and rhythm. Abdomen:  Soft, nontender and nondistended. Normal bowel sounds, without guarding, and without rebound.   Neurologic:  Alert and  oriented x4;  grossly normal neurologically.  Impression/Plan: Cindy Yang is here for an  colonoscopy to be performed for h/o colon adenoma  Risks, benefits, limitations, and alternatives regarding  colonoscopy have been reviewed with the patient.  Questions have been answered.  All parties agreeable.   Sherri Sear, MD  05/15/2022, 7:43 AM

## 2022-05-15 NOTE — Progress Notes (Unsigned)
I,Aaditya Letizia R Jaena Brocato,acting as a Education administrator for [employer], PA-C.,have documented all relevant documentation on the behalf of Mikey Kirschner, PA-C,as directed by  Mikey Kirschner, PA-C while in the presence of Mikey Kirschner, PA-C.   New patient visit   Patient: Cindy Yang   DOB: [birth year]   66 y.o. Female  MRN: [id number] Visit Date: 05/16/2022  Today's healthcare provider: Mikey Kirschner, PA-C   No chief complaint on file.  Subjective    Cindy Yang is a 66 y.o. female who presents today as a new patient to establish care.  Pt has no concerns as of today.  HPI  ***  Past Medical History:  Diagnosis Date   Dyspnea    History of prediabetes 10/18/2015   Shoulder tendonitis, left 10/07/2016   Past Surgical History:  Procedure Laterality Date   APPENDECTOMY     COLONOSCOPY WITH PROPOFOL N/A 05/09/2019   Procedure: COLONOSCOPY WITH PROPOFOL;  Surgeon: Lin Landsman, MD;  Location: [facility] ENDOSCOPY;  Service: Gastroenterology;  Laterality: N/A;   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   Procedure: TOTAL HIP ARTHROPLASTY ANTERIOR APPROACH;  Surgeon: Hessie Knows, MD;  Location: [facility] ORS;  Service: Orthopedics;  Laterality: Left;   TUBAL LIGATION     Family Status  Relation Name Status   Mother  Deceased   Father  Deceased   Brother  Alive   Mat Aunt  Alive   Mat Uncle  Alive   Mat Uncle  Deceased   Other father adopted 81   Family History  Problem Relation Age of Onset   Dementia Mother    Lung cancer Mother    Lung cancer Father    Valvular heart disease Father    Hypertension Brother    Breast cancer Maternal Aunt    Social History   Socioeconomic History   Marital status: Married    Spouse name: Not on file   Number of children: Not on file   Years of education: Not on file   Highest education level: Not on file  Occupational History   Not on file  Tobacco Use   Smoking status: Former    Packs/day: 1.00    Years: 40.00    Total pack  years: 40.00    Types: Cigarettes    Start date: 09/25/1973    Quit date: 10/17/2018    Years since quitting: 3.5   Smokeless tobacco: Never  Vaping Use   Vaping Use: Never used  Substance and Sexual Activity   Alcohol use: No   Drug use: No   Sexual activity: Yes  Other Topics Concern   Not on file  Social History Narrative   Not on file   Social Determinants of Health   Financial Resource Strain: Not on file  Food Insecurity: Not on file  Transportation Needs: Not on file  Physical Activity: Not on file  Stress: Not on file  Social Connections: Not on file   Outpatient Medications Prior to Visit  Medication Sig   albuterol (VENTOLIN HFA) 108 (90 Base) MCG/ACT inhaler Inhale 2 puffs into the lungs every 6 (six) hours as needed for wheezing or shortness of breath. (Patient not taking: Reported on 12/19/2021)   Cetirizine HCl (ZYRTEC ALLERGY PO) Take 10 mg by mouth daily.   fluticasone (FLONASE) 50 MCG/ACT nasal spray Place 2 sprays into both nostrils daily.   tiotropium (SPIRIVA HANDIHALER) 18 MCG inhalation capsule Place 1 capsule (18 mcg total) into inhaler and inhale daily. (Patient not taking:  Reported on 10/16/2020)   varenicline (CHANTIX STARTING MONTH PAK) 0.5 MG X 11 & 1 MG X 42 tablet Take one 0.5 mg tablet by mouth once daily for 3 days, then increase to one 0.5 mg tablet twice daily for 4 days, then increase to one 1 mg tablet twice daily. (Patient not taking: Reported on 12/19/2021)   No facility-administered medications prior to visit.   No Known Allergies  Immunization History  Administered Date(s) Administered   Influenza,inj,Quad PF,6+ Mos 03/29/2019, 03/13/2021, 03/24/2022   Influenza-Unspecified 04/14/2017, 04/19/2018, 02/29/2020   PFIZER(Purple Top)SARS-COV-2 Vaccination 08/27/2019, 09/20/2019   Tdap 04/18/2019    Health Maintenance  Topic Date Due   Zoster Vaccines- Shingrix (1 of 2) Never done   Lung Cancer Screening  08/11/2020   Pneumonia Vaccine 37+  Years old (1 - PCV) Never done   DEXA SCAN  Never done   COVID-19 Vaccine (3 - 2023-24 season) 02/14/2022   MAMMOGRAM  04/01/2024   COLONOSCOPY (Pts 45-83yr Insurance coverage will need to be confirmed)  05/15/2025   DTaP/Tdap/Td (2 - Td or Tdap) 04/17/2029   INFLUENZA VACCINE  Completed   Hepatitis C Screening  Completed   HPV VACCINES  Aged Out    Patient Care Team: PApolonio Schneiders FNP as PCP - General (Nurse Practitioner)  Review of Systems  Genitourinary:  Positive for vaginal discharge.    {Labs  Heme  Chem  Endocrine  Serology  Results Review (optional):23779}   Objective    There were no vitals taken for this visit. {Show previous vital signs (optional):23777}  Physical Exam ***  Depression Screen     No data to display         No results found for any visits on 05/16/22.  Assessment & Plan     ***  No follow-ups on file.     {provider attestation***:1}   LMikey Kirschner PA-C  [phone](phone) [phone](fax)  [place]

## 2022-05-15 NOTE — Anesthesia Preprocedure Evaluation (Signed)
Anesthesia Evaluation  Patient identified by MRN, date of birth, ID band Patient awake    Reviewed: Allergy & Precautions, NPO status , Patient's Chart, lab work & pertinent test results  History of Anesthesia Complications Negative for: history of anesthetic complications  Airway Mallampati: II  TM Distance: >3 FB Neck ROM: Full    Dental no notable dental hx. (+) Teeth Intact   Pulmonary neg sleep apnea, neg COPD, Patient abstained from smoking.Not current smoker, former smoker   Pulmonary exam normal breath sounds clear to auscultation       Cardiovascular Exercise Tolerance: Good METS(-) hypertension(-) CAD and (-) Past MI negative cardio ROS (-) dysrhythmias  Rhythm:Regular Rate:Normal - Systolic murmurs    Neuro/Psych negative neurological ROS  negative psych ROS   GI/Hepatic ,neg GERD  ,,(+)     (-) substance abuse    Endo/Other  neg diabetes    Renal/GU negative Renal ROS     Musculoskeletal   Abdominal   Peds  Hematology   Anesthesia Other Findings Past Medical History: No date: Dyspnea 10/18/2015: History of prediabetes 10/07/2016: Shoulder tendonitis, left  Reproductive/Obstetrics                              Anesthesia Physical Anesthesia Plan  ASA: 2  Anesthesia Plan: General   Post-op Pain Management: Minimal or no pain anticipated   Induction: Intravenous  PONV Risk Score and Plan: 3 and Propofol infusion, TIVA and Ondansetron  Airway Management Planned: Nasal Cannula  Additional Equipment: None  Intra-op Plan:   Post-operative Plan:   Informed Consent: I have reviewed the patients History and Physical, chart, labs and discussed the procedure including the risks, benefits and alternatives for the proposed anesthesia with the patient or authorized representative who has indicated his/her understanding and acceptance.     Dental advisory given  Plan  Discussed with: CRNA and Surgeon  Anesthesia Plan Comments: (Discussed risks of anesthesia with patient, including possibility of difficulty with spontaneous ventilation under anesthesia necessitating airway intervention, PONV, and rare risks such as cardiac or respiratory or neurological events, and allergic reactions. Discussed the role of CRNA in patient's perioperative care. Patient understands.)         Anesthesia Quick Evaluation

## 2022-05-15 NOTE — Op Note (Signed)
[facility] Gastroenterology Patient Name: Cindy Yang Procedure Date: 05/15/2022 8:14 AM MRN: [id number] Account #: [id number] Date of Birth: 03/14/1956 Admit Type: Outpatient Age: 66 Room: [facility] ENDO ROOM 3 Gender: Female Note Status: Finalized Instrument Name: Park Meo [id number] Procedure:             Colonoscopy Indications:           Surveillance: Personal history of adenomatous polyps                         on last colonoscopy 3 years ago, Last colonoscopy:                         November 2020 Providers:             Lin Landsman MD, MD Referring MD:          Apolonio Schneiders (Referring MD) Medicines:             General Anesthesia Complications:         No immediate complications. Estimated blood loss: None. Procedure:             Pre-Anesthesia Assessment:                        - Prior to the procedure, a History and Physical was                         performed, and patient medications and allergies were                         reviewed. The patient is competent. The risks and                         benefits of the procedure and the sedation options and                         risks were discussed with the patient. All questions                         were answered and informed consent was obtained.                         Patient identification and proposed procedure were                         verified by the physician, the nurse, the                         anesthesiologist, the anesthetist and the technician                         in the pre-procedure area in the procedure room in the                         endoscopy suite. Mental Status Examination: alert and                         oriented. Airway Examination: normal oropharyngeal  airway and neck mobility. Respiratory Examination:                         clear to auscultation. CV Examination: normal.                         Prophylactic Antibiotics: The patient  does not require                         prophylactic antibiotics. Prior Anticoagulants: The                         patient has taken no anticoagulant or antiplatelet                         agents. ASA Grade Assessment: II - A patient with mild                         systemic disease. After reviewing the risks and                         benefits, the patient was deemed in satisfactory                         condition to undergo the procedure. The anesthesia                         plan was to use general anesthesia. Immediately prior                         to administration of medications, the patient was                         re-assessed for adequacy to receive sedatives. The                         heart rate, respiratory rate, oxygen saturations,                         blood pressure, adequacy of pulmonary ventilation, and                         response to care were monitored throughout the                         procedure. The physical status of the patient was                         re-assessed after the procedure.                        After obtaining informed consent, the colonoscope was                         passed under direct vision. Throughout the procedure,                         the patient's blood pressure, pulse, and oxygen  saturations were monitored continuously. The                         Colonoscope was introduced through the anus and                         advanced to the the cecum, identified by appendiceal                         orifice and ileocecal valve. The colonoscopy was                         performed without difficulty. The patient tolerated                         the procedure well. The quality of the bowel                         preparation was evaluated using the BBPS [facility] Bowel                         Preparation Scale) with scores of: Right Colon = 3,                         Transverse Colon = 3 and Left  Colon = 3 (entire mucosa                         seen well with no residual staining, small fragments                         of stool or opaque liquid). The total BBPS score                         equals 9. The ileocecal valve, appendiceal orifice,                         and rectum were photographed. Findings:      The perianal and digital rectal examinations were normal. Pertinent       negatives include normal sphincter tone and no palpable rectal lesions.      A 5 mm polyp was found in the descending colon. The polyp was sessile.       The polyp was removed with a cold snare. Resection and retrieval were       complete.      The retroflexed view of the distal rectum and anal verge was normal and       showed no anal or rectal abnormalities. Impression:            - One 5 mm polyp in the descending colon, removed with                         a cold snare. Resected and retrieved.                        - The distal rectum and anal verge are normal on  retroflexion view. Recommendation:        - Discharge patient to home (with escort).                        - Resume previous diet today.                        - Continue present medications.                        - Await pathology results.                        - Repeat colonoscopy in 7-10 years for surveillance                         based on pathology results. Procedure Code(s):     --- Professional ---                        [id number], Colonoscopy, flexible; with removal of                         tumor(s), polyp(s), or other lesion(s) by snare                         technique Diagnosis Code(s):     --- Professional ---                        Z86.010, Personal history of colonic polyps                        D12.4, Benign neoplasm of descending colon CPT copyright 2022 American Medical Association. All rights reserved. The codes documented in this report are preliminary and upon coder review may  be  revised to meet current compliance requirements. Dr. Ulyess Mort Lin Landsman MD, MD 05/15/2022 8:40:15 AM This report has been signed electronically. Number of Addenda: 0 Note Initiated On: 05/15/2022 8:14 AM Scope Withdrawal Time: 0 hours 9 minutes 0 seconds  Total Procedure Duration: 0 hours 16 minutes 3 seconds  Estimated Blood Loss:  Estimated blood loss: none.      [facility]

## 2022-05-15 NOTE — Anesthesia Postprocedure Evaluation (Signed)
Anesthesia Post Note  Patient: Cindy Yang  Procedure(s) Performed: COLONOSCOPY WITH PROPOFOL  Patient location during evaluation: Endoscopy Anesthesia Type: General Level of consciousness: awake and alert Pain management: pain level controlled Vital Signs Assessment: post-procedure vital signs reviewed and stable Respiratory status: spontaneous breathing, nonlabored ventilation, respiratory function stable and patient connected to nasal cannula oxygen Cardiovascular status: blood pressure returned to baseline and stable Postop Assessment: no apparent nausea or vomiting Anesthetic complications: no   No notable events documented.   Last Vitals:  Vitals:   05/15/22 0851 05/15/22 0901  BP:  133/72  Pulse: 72 66  Resp:    Temp:    SpO2: 100% 100%    Last Pain:  Vitals:   05/15/22 0901  TempSrc:   PainSc: 0-No pain                 Arita Miss

## 2022-05-15 NOTE — Transfer of Care (Signed)
Immediate Anesthesia Transfer of Care Note  Patient: Cindy Yang  Procedure(s) Performed: COLONOSCOPY WITH PROPOFOL  Patient Location: Endoscopy Unit  Anesthesia Type:General  Level of Consciousness: awake  Airway & Oxygen Therapy: Patient Spontanous Breathing  Post-op Assessment: Report given to RN and Post -op Vital signs reviewed and stable  Post vital signs: Reviewed  Last Vitals:  Vitals Value Taken Time  BP    Temp    Pulse 85 05/15/22 0842  Resp 25 05/15/22 0842  SpO2 97 % 05/15/22 0842  Vitals shown include unvalidated device data.  Last Pain:  Vitals:   05/15/22 0841  TempSrc: Temporal  PainSc:          Complications: No notable events documented.

## 2022-05-16 ENCOUNTER — Ambulatory Visit (INDEPENDENT_AMBULATORY_CARE_PROVIDER_SITE_OTHER): Payer: BC Managed Care – PPO | Admitting: Physician Assistant

## 2022-05-16 ENCOUNTER — Encounter: Payer: Self-pay | Admitting: Physician Assistant

## 2022-05-16 VITALS — BP 130/71 | HR 89 | Temp 98.8°F | Ht 67.5 in | Wt 221.8 lb

## 2022-05-16 DIAGNOSIS — E559 Vitamin D deficiency, unspecified: Secondary | ICD-10-CM | POA: Diagnosis not present

## 2022-05-16 DIAGNOSIS — Z87891 Personal history of nicotine dependence: Secondary | ICD-10-CM | POA: Diagnosis not present

## 2022-05-16 LAB — SURGICAL PATHOLOGY

## 2022-05-16 MED ORDER — VITAMIN D3 250 MCG (10000 UT) PO CAPS: 10000.0000 [IU] | ORAL_CAPSULE | Freq: Every day | ORAL | 0 refills | Status: AC

## 2022-05-19 ENCOUNTER — Encounter: Payer: Self-pay | Admitting: Physician Assistant

## 2022-05-19 ENCOUNTER — Encounter: Payer: Self-pay | Admitting: Gastroenterology

## 2022-05-19 DIAGNOSIS — E559 Vitamin D deficiency, unspecified: Secondary | ICD-10-CM | POA: Insufficient documentation

## 2022-05-19 NOTE — Assessment & Plan Note (Addendum)
In need of LDCT  Smoking history: 40 pack year history, quit in 2020 Last CT chest (non screening) 2/21

## 2022-05-19 NOTE — Assessment & Plan Note (Signed)
Last lab draw, low vit D. Pt denies supplementation  Advised daily vs weekly, pt prefers weekly supplementation

## 2022-06-24 NOTE — Progress Notes (Unsigned)
     I,[name],acting as a scribe for [employer], PA-C.,have documented all relevant documentation on the behalf of Mikey Kirschner, PA-C,as directed by  Mikey Kirschner, PA-C while in the presence of Mikey Kirschner, PA-C.   Established patient visit   Patient: Cindy Yang   DOB: Jan 05, 1956   67 y.o. Female  MRN: [id number] Visit Date: 06/25/2022  Today's healthcare provider: Mikey Kirschner, PA-C   Chief Complaint  Patient presents with   Pap only   Subjective    HPI  Patient here for pap smear for cervical cancer screening. Denies symptoms or concerns. Medications: Outpatient Medications Prior to Visit  Medication Sig   Cetirizine HCl (ZYRTEC ALLERGY PO) Take 10 mg by mouth daily.   Cholecalciferol (VITAMIN D3) 250 MCG (10000 UT) capsule Take 1 capsule (10,000 Units total) by mouth daily.   fluticasone (FLONASE) 50 MCG/ACT nasal spray Place 2 sprays into both nostrils daily.   albuterol (VENTOLIN HFA) 108 (90 Base) MCG/ACT inhaler Inhale 2 puffs into the lungs every 6 (six) hours as needed for wheezing or shortness of breath. (Patient not taking: Reported on 12/19/2021)   tiotropium (SPIRIVA HANDIHALER) 18 MCG inhalation capsule Place 1 capsule (18 mcg total) into inhaler and inhale daily. (Patient not taking: Reported on 10/16/2020)   No facility-administered medications prior to visit.    Review of Systems  Constitutional:  Negative for fatigue and fever.  Respiratory:  Negative for cough and shortness of breath.   Cardiovascular:  Negative for chest pain and leg swelling.  Gastrointestinal:  Negative for abdominal pain.  Neurological:  Negative for dizziness and headaches.      Objective    BP (!) 133/58 (BP Location: Left Arm, Patient Position: Sitting, Cuff Size: Large)   Pulse 82   Temp 97.8 F (36.6 C) (Oral)   Resp 16   Wt 223 lb (101.2 kg)   BMI 34.41 kg/m   Physical Exam Genitourinary:    Labia:        Right: No rash or lesion.        Left:  No rash or lesion.      Vagina: Normal.     Cervix: Cervical bleeding present.     Comments: Minimal bleeding after specimen obtained    No results found for any visits on 06/25/22.  Assessment & Plan     Problem List Items Addressed This Visit   None Visit Diagnoses     Cervical cancer screening    -  Primary   Relevant Orders   Cytology - PAP       I, Mikey Kirschner, PA-C have reviewed all documentation for this visit. The documentation on  06/25/22  for the exam, diagnosis, procedures, and orders are all accurate and complete.  Mikey Kirschner, PA-C [facility] [address] [place], [place], [address] Office: [phone] Fax: [place]

## 2022-06-25 ENCOUNTER — Encounter: Payer: Self-pay | Admitting: Physician Assistant

## 2022-06-25 ENCOUNTER — Other Ambulatory Visit (HOSPITAL_COMMUNITY)
Admission: RE | Admit: 2022-06-25 | Discharge: 2022-06-25 | Disposition: A | Discharge status: Home / Self Care | Payer: BC Managed Care – PPO | Source: Ambulatory Visit | Attending: Physician Assistant | Admitting: Physician Assistant

## 2022-06-25 ENCOUNTER — Ambulatory Visit: Payer: BC Managed Care – PPO | Admitting: Physician Assistant

## 2022-06-25 VITALS — BP 133/58 | HR 82 | Temp 97.8°F | Resp 16 | Wt 223.0 lb

## 2022-06-25 DIAGNOSIS — Z124 Encounter for screening for malignant neoplasm of cervix: Secondary | ICD-10-CM | POA: Insufficient documentation

## 2022-06-30 ENCOUNTER — Encounter: Payer: Self-pay | Admitting: Family Medicine

## 2022-06-30 ENCOUNTER — Ambulatory Visit: Payer: BC Managed Care – PPO | Admitting: Family Medicine

## 2022-06-30 VITALS — BP 133/66 | HR 72 | Temp 98.6°F | Resp 16 | Wt 225.3 lb

## 2022-06-30 DIAGNOSIS — R319 Hematuria, unspecified: Secondary | ICD-10-CM | POA: Diagnosis not present

## 2022-06-30 DIAGNOSIS — R300 Dysuria: Secondary | ICD-10-CM | POA: Insufficient documentation

## 2022-06-30 DIAGNOSIS — N3000 Acute cystitis without hematuria: Secondary | ICD-10-CM | POA: Diagnosis not present

## 2022-06-30 LAB — POCT URINALYSIS DIPSTICK
Bilirubin, UA: NEGATIVE
Glucose, UA: NEGATIVE
Ketones, UA: NEGATIVE
Nitrite, UA: POSITIVE
Protein, UA: NEGATIVE
Spec Grav, UA: 1.02 (ref 1.010–1.025)
Urobilinogen, UA: 0.2 E.U./dL
pH, UA: 6 (ref 5.0–8.0)

## 2022-06-30 LAB — CYTOLOGY - PAP
Comment: NEGATIVE
Diagnosis: NEGATIVE
High risk HPV: NEGATIVE

## 2022-06-30 MED ORDER — CEPHALEXIN 500 MG PO CAPS: 500.0000 mg | ORAL_CAPSULE | Freq: Three times a day (TID) | ORAL | 0 refills | Status: DC

## 2022-06-30 NOTE — Assessment & Plan Note (Signed)
Urine analysis completed, consistent with urinary tract infection Will treat as stated above

## 2022-06-30 NOTE — Progress Notes (Signed)
I,Cindy Yang,acting as a scribe for [employer], MD.,have documented all relevant documentation on the behalf of Cindy Foster, MD,as directed by  Cindy Foster, MD while in the presence of Cindy Foster, MD.   Established patient visit   Patient: Cindy Yang   DOB: January 14, 1956   67 y.o. Female  MRN: [id number] Visit Date: 06/30/2022  Today's healthcare provider: [name], MD   Chief Complaint  Patient presents with   Dysuria   Subjective    Dysuria  This is a new problem. Episode onset: Friday afternoon. The problem occurs every urination. The problem has been unchanged. The quality of the pain is described as burning. The pain is moderate. There has been no fever. She is Sexually active. There is No history of pyelonephritis. Associated symptoms include urgency. Pertinent negatives include no chills, discharge, frequency, hematuria, nausea, possible pregnancy or vomiting. Associated symptoms comments: Some blood in urine on Friday Pain in lower abdomen when urinating. . She has tried increased fluids (AZO pills and cranberry juice) for the symptoms. The treatment provided mild relief.     Medications: Outpatient Medications Prior to Visit  Medication Sig   Cetirizine HCl (ZYRTEC ALLERGY PO) Take 10 mg by mouth daily.   Cholecalciferol (VITAMIN D3) 250 MCG (10000 UT) capsule Take 1 capsule (10,000 Units total) by mouth daily.   fluticasone (FLONASE) 50 MCG/ACT nasal spray Place 2 sprays into both nostrils daily.   tiotropium (SPIRIVA HANDIHALER) 18 MCG inhalation capsule Place 1 capsule (18 mcg total) into inhaler and inhale daily.   albuterol (VENTOLIN HFA) 108 (90 Base) MCG/ACT inhaler Inhale 2 puffs into the lungs every 6 (six) hours as needed for wheezing or shortness of breath. (Patient not taking: Reported on 06/30/2022)   No facility-administered medications prior to visit.    Review of Systems   Constitutional:  Negative for chills.  Gastrointestinal:  Negative for nausea and vomiting.  Genitourinary:  Positive for dysuria and urgency. Negative for frequency and hematuria.       Objective    BP 133/66 (BP Location: Left Arm, Patient Position: Sitting, Cuff Size: Large)   Pulse 72   Temp 98.6 F (37 C) (Oral)   Resp 16   Wt 225 lb 4.8 oz (102.2 kg)   BMI 34.77 kg/m    Physical Exam Vitals reviewed.  Constitutional:      General: She is not in acute distress.    Appearance: Normal appearance. She is not ill-appearing.  Abdominal:     General: There is no distension.     Palpations: Abdomen is soft.     Tenderness: There is no abdominal tenderness. There is no right CVA tenderness, left CVA tenderness, guarding or rebound.  Neurological:     Mental Status: She is alert.      Results for orders placed or performed in visit on 06/30/22  POCT urinalysis dipstick  Result Value Ref Range   Color, UA Yellow    Clarity, UA cloudy    Glucose, UA Negative Negative   Bilirubin, UA Negative    Ketones, UA Negative    Spec Grav, UA 1.020 1.010 - 1.025   Blood, UA Trace    pH, UA 6.0 5.0 - 8.0   Protein, UA Negative Negative   Urobilinogen, UA 0.2 0.2 or 1.0 E.U./dL   Nitrite, UA Positive    Leukocytes, UA Large (3+) (A) Negative   Appearance     Odor      Assessment &  Plan     Problem List Items Addressed This Visit       Genitourinary   Acute cystitis without hematuria - Primary    Urinary tract infection consistent with urine analysis Will send for culture Will prescribe Keflex 500 mg 3 times daily for 7-day course Discussed return precautions including development of fever, chills, back pain, gross hematuria Patient voiced understanding         Other   Dysuria    Urine analysis completed, consistent with urinary tract infection Will treat as stated above      Relevant Orders   POCT urinalysis dipstick (Completed)   Urine Microscopic   Urine  Culture   Other Visit Diagnoses     Hematuria, unspecified type       Relevant Orders   Urine Microscopic        Return If develops fevers, chills or hematuria.      I, Cindy Foster, MD, have reviewed all documentation for this visit.  Portions of this information were initially documented by the Cindy Yang, CMA and reviewed by me for thoroughness and accuracy.     Cindy Foster, MD  [facility] [phone] (phone) [phone] (fax)  [place]

## 2022-06-30 NOTE — Assessment & Plan Note (Signed)
Urinary tract infection consistent with urine analysis Will send for culture Will prescribe Keflex 500 mg 3 times daily for 7-day course Discussed return precautions including development of fever, chills, back pain, gross hematuria Patient voiced understanding

## 2022-07-01 LAB — URINALYSIS, MICROSCOPIC ONLY
Casts: NONE SEEN /lpf
WBC, UA: >30 /hpf — AB (ref 0–5)

## 2022-07-03 ENCOUNTER — Other Ambulatory Visit: Payer: Self-pay | Admitting: Family Medicine

## 2022-07-03 DIAGNOSIS — N3000 Acute cystitis without hematuria: Secondary | ICD-10-CM

## 2022-07-03 LAB — URINE CULTURE

## 2022-07-03 MED ORDER — AMOXICILLIN-POT CLAVULANATE 875-125 MG PO TABS: 1.0000 | ORAL_TABLET | Freq: Two times a day (BID) | ORAL | 0 refills | Status: AC

## 2022-07-16 ENCOUNTER — Ambulatory Visit: Payer: BC Managed Care – PPO | Admitting: Physician Assistant

## 2022-07-16 ENCOUNTER — Encounter: Payer: Self-pay | Admitting: Physician Assistant

## 2022-07-16 VITALS — BP 145/63 | HR 79 | Temp 99.1°F | Ht 67.5 in | Wt 225.8 lb

## 2022-07-16 DIAGNOSIS — N3001 Acute cystitis with hematuria: Secondary | ICD-10-CM

## 2022-07-16 LAB — POCT URINALYSIS DIPSTICK
Bilirubin, UA: NEGATIVE
Glucose, UA: NEGATIVE
Ketones, UA: NEGATIVE
Protein, UA: POSITIVE — AB
Spec Grav, UA: >=1.03 — AB (ref 1.010–1.025)
Urobilinogen, UA: 0.2 E.U./dL
pH, UA: 6 (ref 5.0–8.0)

## 2022-07-16 MED ORDER — SULFAMETHOXAZOLE-TRIMETHOPRIM 800-160 MG PO TABS: 1.0000 | ORAL_TABLET | Freq: Two times a day (BID) | ORAL | 0 refills | Status: AC

## 2022-07-16 NOTE — Progress Notes (Signed)
Established patient visit   Patient: Cindy Yang   DOB: 08-12-1955   67 y.o. Female  MRN: [id number] Visit Date: 07/16/2022  Today's healthcare provider: Mikey Kirschner, PA-C   Cc. Dysuria x 1 day  Subjective    HPI   Pt was seen 1/15 for dysuria-- put on kelfex, found to be ecoli resistant to keflex, pt switched to amoxicillin.   Pt reports symptoms resolved but returned this morning with dysuria, hematuria, frequency. Denies low back pain, abdominal pain, malaise, fevers, myalgias.   Medications: Outpatient Medications Prior to Visit  Medication Sig   Cetirizine HCl (ZYRTEC ALLERGY PO) Take 10 mg by mouth daily.   fluticasone (FLONASE) 50 MCG/ACT nasal spray Place 2 sprays into both nostrils daily.   albuterol (VENTOLIN HFA) 108 (90 Base) MCG/ACT inhaler Inhale 2 puffs into the lungs every 6 (six) hours as needed for wheezing or shortness of breath. (Patient not taking: Reported on 07/16/2022)   Cholecalciferol (VITAMIN D3) 250 MCG (10000 UT) capsule Take 1 capsule (10,000 Units total) by mouth daily. (Patient not taking: Reported on 07/16/2022)   tiotropium (SPIRIVA HANDIHALER) 18 MCG inhalation capsule Place 1 capsule (18 mcg total) into inhaler and inhale daily. (Patient not taking: Reported on 07/16/2022)   No facility-administered medications prior to visit.    Review of Systems  Constitutional:  Negative for fatigue and fever.  Respiratory:  Negative for cough and shortness of breath.   Cardiovascular:  Negative for chest pain and leg swelling.  Gastrointestinal:  Negative for abdominal pain.  Genitourinary:  Positive for dysuria, frequency and hematuria.  Neurological:  Negative for dizziness and headaches.       Objective    BP (!) 145/63   Pulse 79   Temp 99.1 F (37.3 C)   Ht 5' 7.5" (1.715 m)   Wt 225 lb 12.8 oz (102.4 kg)   SpO2 96%   BMI 34.84 kg/m   Physical Exam Vitals reviewed.  Constitutional:      Appearance: She is not ill-appearing.   HENT:     Head: Normocephalic.  Eyes:     Conjunctiva/sclera: Conjunctivae normal.  Cardiovascular:     Rate and Rhythm: Normal rate.  Pulmonary:     Effort: Pulmonary effort is normal. No respiratory distress.  Abdominal:     General: Abdomen is flat. There is no distension.     Tenderness: There is no abdominal tenderness. There is no right CVA tenderness or left CVA tenderness.  Neurological:     General: No focal deficit present.     Mental Status: She is alert and oriented to person, place, and time.  Psychiatric:        Mood and Affect: Mood normal.        Behavior: Behavior normal.      Results for orders placed or performed in visit on 07/16/22  POCT Urinalysis Dipstick  Result Value Ref Range   Color, UA orange    Clarity, UA cloudy    Glucose, UA Negative Negative   Bilirubin, UA neg    Ketones, UA neg    Spec Grav, UA >=1.030 (A) 1.010 - 1.025   Blood, UA large    pH, UA 6.0 5.0 - 8.0   Protein, UA Positive (A) Negative   Urobilinogen, UA 0.2 0.2 or 1.0 E.U./dL   Nitrite, UA large    Leukocytes, UA Large (3+) (A) Negative   Appearance     Odor  Assessment & Plan     Acute cystitis Based on C+S last infection, will tx with bactrim UA + blood, leuk, nitrites  Sending for culture again  Advised to watch for -- fever, back pain, nausea, fatigue  Return if symptoms worsen or fail to improve.      I, Mikey Kirschner, PA-C have reviewed all documentation for this visit. The documentation on  07/16/22 for the exam, diagnosis, procedures, and orders are all accurate and complete.  Mikey Kirschner, PA-C [facility] [address] [place], [place], [address] Office: [phone] Fax: [place]

## 2022-07-19 LAB — URINE CULTURE

## 2023-03-19 ENCOUNTER — Ambulatory Visit: Payer: Self-pay

## 2023-03-26 ENCOUNTER — Ambulatory Visit (INDEPENDENT_AMBULATORY_CARE_PROVIDER_SITE_OTHER): Payer: Self-pay

## 2023-03-26 DIAGNOSIS — Z23 Encounter for immunization: Secondary | ICD-10-CM

## 2023-03-31 ENCOUNTER — Other Ambulatory Visit: Payer: Self-pay | Admitting: Physician Assistant

## 2023-03-31 DIAGNOSIS — Z1231 Encounter for screening mammogram for malignant neoplasm of breast: Secondary | ICD-10-CM

## 2023-04-07 ENCOUNTER — Ambulatory Visit
Admission: RE | Admit: 2023-04-07 | Discharge: 2023-04-07 | Disposition: A | Discharge status: Home / Self Care | Payer: Medicare HMO | Source: Ambulatory Visit | Attending: Physician Assistant | Admitting: Physician Assistant

## 2023-04-07 DIAGNOSIS — Z1231 Encounter for screening mammogram for malignant neoplasm of breast: Secondary | ICD-10-CM

## 2023-10-05 ENCOUNTER — Ambulatory Visit: Payer: Self-pay

## 2023-10-05 NOTE — Telephone Encounter (Signed)
 Chief Complaint: right leg swelling Symptoms: right lower leg swelling, cyst on left side of back Frequency: cyst x 2 weeks, leg swelling x 5 days Pertinent Negatives: Patient denies fever, chest pain, SOB, recent prolonged travel, recent major surgery, redness Disposition: [] ED /[] Urgent Care (no appt availability in office) / [x] Appointment(In office/virtual)/ []  [facility]/ [] Home Care/ [] Refused Recommended Disposition /[] [facility]/ []  Follow-up with PCP Additional Notes: Patient states her husband has been squeezing a cyst on her left side of her back for about 2 weeks. She states she is unsure if they have been successful at fully draining it. Patient states she is able to bear weight on her right leg. No available appts today with PCP or at [facility]. Advised patient for any new or worsening symptoms to go to urgent care or ED.  Copied from CRM [id number]. Topic: Clinical - Red Word Triage >> Oct 05, 2023 10:14 AM Caliyah H wrote: [facility] that prompted transfer to Nurse Triage: Patient called this morning reporting swelling in her right leg and a possible cyst on her back. She stated that the swelling has not improved over the weekend.  Callback Number: [phone] Reason for Disposition  [1] Thigh, calf, or ankle swelling AND [2] only 1 side  Answer Assessment - Initial Assessment Questions 1. ONSET: "When did the swelling start?" (e.g., minutes, hours, days)     X 5 days.  2. LOCATION: "What part of the leg is swollen?"  "Are both legs swollen or just one leg?"     Right leg, from knee down to ankle/foot.  3. SEVERITY: "How bad is the swelling?" (e.g., localized; mild, moderate, severe)   - Localized: Small area of swelling localized to one leg.   - MILD pedal edema: Swelling limited to foot and ankle, pitting edema < 1/4 inch (6 mm) deep, rest and elevation eliminate most or all swelling.   - MODERATE edema: Swelling of lower leg to knee, pitting edema >  1/4 inch (6 mm) deep, rest and elevation only partially reduce swelling.   - SEVERE edema: Swelling extends above knee, facial or hand swelling present.      Moderate.  4. REDNESS: "Does the swelling look red or infected?"     Denies.  5. PAIN: "Is the swelling painful to touch?" If Yes, ask: "How painful is it?"   (Scale 1-10; mild, moderate or severe)     She states it really doesn't hurt unless she puts a lot of pressure on it.  6. FEVER: "Do you have a fever?" If Yes, ask: "What is it, how was it measured, and when did it start?"      Denies.  7. CAUSE: "What do you think is causing the leg swelling?"     She states she works on her feet so she thinks that could affect it.  8. MEDICAL HISTORY: "Do you have a history of blood clots (e.g., DVT), cancer, heart failure, kidney disease, or liver failure?"     Denies.  9. RECURRENT SYMPTOM: "Have you had leg swelling before?" If Yes, ask: "When was the last time?" "What happened that time?"     She states her foot would swell daily after being on them for work.  10. OTHER SYMPTOMS: "Do you have any other symptoms?" (e.g., chest pain, difficulty breathing)       Posterior knee pain.  11. PREGNANCY: "Is there any chance you are pregnant?" "When was your last menstrual period?"  N/A.  Protocols used: Leg Swelling and Edema-A-AH

## 2023-10-06 ENCOUNTER — Ambulatory Visit (INDEPENDENT_AMBULATORY_CARE_PROVIDER_SITE_OTHER): Admitting: Family Medicine

## 2023-10-06 ENCOUNTER — Encounter: Payer: Self-pay | Admitting: Family Medicine

## 2023-10-06 VITALS — BP 127/69 | HR 78 | Ht 67.5 in | Wt 229.8 lb

## 2023-10-06 DIAGNOSIS — M7121 Synovial cyst of popliteal space [Baker], right knee: Secondary | ICD-10-CM

## 2023-10-06 DIAGNOSIS — L720 Epidermal cyst: Secondary | ICD-10-CM | POA: Diagnosis not present

## 2023-10-06 MED ORDER — MELOXICAM 15 MG PO TABS: 15.0000 mg | ORAL_TABLET | Freq: Every day | ORAL | 0 refills | Status: AC

## 2023-10-06 NOTE — Progress Notes (Signed)
 Acute visit   Patient: Cindy Yang   DOB: 09-15-1955   68 y.o. Female  MRN: [id number] PCP: Mimi Alt, MD   Chief Complaint  Patient presents with   Leg Swelling    Moderate right lower leg edema associated with pain x 5 days. Patient states she is able to bear weight on her right leg. Reports her feet would swell daily after being on them at work. Edema is not as bad    Cyst    cyst on left side of back x 2 weeks.  Patient states her husband has been squeezing a cyst on her left side of her back for about 2 weeks. She states she is unsure if they have been successful at fully draining it. Reports it wasleaing fluid last night    Subjective    Discussed the use of AI scribe software for clinical note transcription with the patient, who gave verbal consent to proceed.  History of Present Illness   The patient presents with a cyst on her back and swelling in her legs. The cyst, which has been present for a couple of years, has been increasing in size and occasionally bursts, releasing fluid. Despite this, the patient reports no pain unless the cyst is pressed hard. To manage the leakage, the patient applies a band-aid to prevent it from staining her clothes.  The patient also reports a soreness in her right leg, which initially started as a cramp. Over the weekend, she noticed swelling in her legs and feet, particularly in the afternoon. The patient spends six hours a day on her feet, which may contribute to the swelling. She denies any shortness of breath.        Review of Systems   Objective    BP 127/69 (BP Location: Left Arm, Patient Position: Sitting, Cuff Size: Large)   Pulse 78   Ht 5' 7.5" (1.715 m)   Wt 229 lb 12.8 oz (104.2 kg)   SpO2 99%   BMI 35.46 kg/m   Physical Exam   Physical Exam   CHEST: Clear to auscultation bilaterally. No wheezes, rhonchi, or crackles. MUSCULOSKELETAL: Tenderness in the right knee and posterior knee  region. SKIN: Epidermal inclusion cyst on back, not infected.       No results found for any visits on 10/06/23.  Assessment & Plan     Problem List Items Addressed This Visit   None Visit Diagnoses       Synovial cyst of right popliteal space    -  Primary     Epidermal inclusion cyst       Relevant Orders   Ambulatory referral to Dermatology           Baker's cyst Baker's cyst in the right knee causing pain and swelling extending into the calf, likely related to arthritis or degenerative changes. Benign but painful condition. - Recommend rest and ice application to reduce inflammation. - Prescribe meloxicam  15 mg once daily with food for 2-4 weeks. - Advise use of a compression sleeve on the knee to prevent fluid accumulation. - Refer to orthopedist if symptoms do not improve for possible aspiration.  Epidermal inclusion cyst Epidermal inclusion cyst on the back, present for years, occasionally draining but refilling due to the presence of a sac. Not currently infected. Requires dermatological evaluation for potential excision to prevent recurrence. - Refer to dermatologist for evaluation and potential excision of the cyst. - Advise warm compresses if the cyst becomes  swollen and full again.       Meds ordered this encounter  Medications   meloxicam  (MOBIC ) 15 MG tablet    Sig: Take 1 tablet (15 mg total) by mouth daily.    Dispense:  30 tablet    Refill:  0     Return for CPE, With PCP.      Aden Agreste, MD  [facility] [phone] (phone) [phone] (fax)  [facility]

## 2023-11-11 NOTE — Progress Notes (Deleted)
   There were no vitals taken for this visit.   Subjective:    Patient ID: Cindy Yang, female    DOB: 1955/10/10, 68 y.o.   MRN: [id number]  HPI: Cindy Yang is a 68 y.o. female  No chief complaint on file.   Discussed the use of AI scribe software for clinical note transcription with the patient, who gave verbal consent to proceed.  History of Present Illness          10/06/2023    4:15 PM 06/30/2022    3:19 PM 05/16/2022    2:01 PM  Depression screen PHQ 2/9  Decreased Interest 0 0 0  Down, Depressed, Hopeless 0 0 0  PHQ - 2 Score 0 0 0  Altered sleeping  0 0  Tired, decreased energy  0 0  Change in appetite  0 0  Feeling bad or failure about yourself   0 0  Trouble concentrating  0 0  Moving slowly or fidgety/restless  0 0  Suicidal thoughts  0 0  PHQ-9 Score  0 0  Difficult doing work/chores  Not difficult at all Not difficult at all    Relevant past medical, surgical, family and social history reviewed and updated as indicated. Interim medical history since our last visit reviewed. Allergies and medications reviewed and updated.  Review of Systems  Per HPI unless specifically indicated above     Objective:      There were no vitals taken for this visit.  {Vitals History (Optional):23777} Wt Readings from Last 3 Encounters:  10/06/23 229 lb 12.8 oz (104.2 kg)  07/16/22 225 lb 12.8 oz (102.4 kg)  06/30/22 225 lb 4.8 oz (102.2 kg)    Physical Exam Physical Exam    Results for orders placed or performed in visit on 07/16/22  Urine Culture   Collection Time: 07/16/22 12:00 AM   Specimen: Urine   Urine  Result Value Ref Range   Urine Culture, Routine Final report (A)    Organism ID, Bacteria Escherichia coli (A)    Antimicrobial Susceptibility Comment   POCT Urinalysis Dipstick   Collection Time: 07/16/22 10:14 AM  Result Value Ref Range   Color, UA orange    Clarity, UA cloudy    Glucose, UA Negative Negative   Bilirubin, UA neg     Ketones, UA neg    Spec Grav, UA >=1.030 (A) 1.010 - 1.025   Blood, UA large    pH, UA 6.0 5.0 - 8.0   Protein, UA Positive (A) Negative   Urobilinogen, UA 0.2 0.2 or 1.0 E.U./dL   Nitrite, UA large    Leukocytes, UA Large (3+) (A) Negative   Appearance     Odor     {Labs (Optional):23779}       Assessment & Plan:   Problem List Items Addressed This Visit   None    Assessment and Plan Assessment & Plan         Follow up plan: No follow-ups on file.

## 2023-11-12 ENCOUNTER — Other Ambulatory Visit: Payer: Self-pay | Admitting: Physician Assistant

## 2023-11-12 ENCOUNTER — Ambulatory Visit
Admission: RE | Admit: 2023-11-12 | Discharge: 2023-11-12 | Disposition: A | Discharge status: Home / Self Care | Source: Ambulatory Visit | Attending: Physician Assistant | Admitting: Physician Assistant

## 2023-11-12 ENCOUNTER — Ambulatory Visit: Admitting: Nurse Practitioner

## 2023-11-12 DIAGNOSIS — M25561 Pain in right knee: Secondary | ICD-10-CM

## 2023-11-19 ENCOUNTER — Encounter: Payer: Self-pay | Admitting: Family Medicine

## 2023-11-19 ENCOUNTER — Ambulatory Visit (INDEPENDENT_AMBULATORY_CARE_PROVIDER_SITE_OTHER): Admitting: Family Medicine

## 2023-11-19 VITALS — BP 138/82 | HR 77 | Ht 67.0 in | Wt 233.0 lb

## 2023-11-19 DIAGNOSIS — Z780 Asymptomatic menopausal state: Secondary | ICD-10-CM

## 2023-11-19 DIAGNOSIS — Z87891 Personal history of nicotine dependence: Secondary | ICD-10-CM | POA: Diagnosis not present

## 2023-11-19 DIAGNOSIS — M25561 Pain in right knee: Secondary | ICD-10-CM | POA: Diagnosis not present

## 2023-11-19 DIAGNOSIS — E559 Vitamin D deficiency, unspecified: Secondary | ICD-10-CM | POA: Diagnosis not present

## 2023-11-19 DIAGNOSIS — R030 Elevated blood-pressure reading, without diagnosis of hypertension: Secondary | ICD-10-CM | POA: Diagnosis not present

## 2023-11-19 MED ORDER — TRAMADOL HCL 50 MG PO TABS: 50.0000 mg | ORAL_TABLET | Freq: Three times a day (TID) | ORAL | 0 refills | Status: AC | PRN

## 2023-11-19 NOTE — Assessment & Plan Note (Signed)
 Chronic  Last vitamin D  Lab Results  Component Value Date   VD25OH 13.7 (L) 01/29/2022  Continue supplementation  Will check vitamin D  levles today

## 2023-11-19 NOTE — Patient Instructions (Signed)
 [facility] [address] [place],  [place]  [address] Main: [phone]

## 2023-11-19 NOTE — Progress Notes (Signed)
 Established patient visit   Patient: Cindy Yang   DOB: [birth year]   68 y.o. Female  MRN: [id number] Visit Date: 11/19/2023  Today's healthcare provider: Mimi Alt, MD   Chief Complaint  Patient presents with   Knee Pain    R knee pain for about a month    Subjective     HPI     Knee Pain    Additional comments: R knee pain for about a month       Last edited by Bart Lieu, CMA on 11/19/2023  3:38 PM.       Discussed the use of AI scribe software for clinical note transcription with the patient, who gave verbal consent to proceed.  History of Present Illness Cindy Yang is a 68 year old female who presents with knee pain.  She has been experiencing severe right knee pain for one month, located behind the knee. An x-ray showed no fractures, and an ultrasound ruled out a blood clot but suggested a complex cystic lesion. An MRI was performed yesterday, and she is scheduled to follow up with orthopedics on June 10th. Meloxicam , taken since April, has not provided significant relief.  Her blood pressure is elevated at 158/81, and she is not on any antihypertensive medication. She attributes the elevated blood pressure to her knee pain. No stress or other contributing factors are reported.  Her past medical history includes shoulder tendonitis, localized osteoarthritis, and a left hip replacement. She quit smoking in 2020 after 45 pack years. Her current medications include Spiriva  as needed, Flonase 50 mcg, Zyrtec 10 mg, meloxicam , and vitamin D3 250 mcg daily. She uses albuterol  as needed for respiratory issues.  No history of seizures and she has not previously taken tramadol.   Past Medical History:  Diagnosis Date   Dyspnea    Encounter for screening colonoscopy    History of prediabetes 10/18/2015   Shoulder tendonitis, left 10/07/2016    Medications: Outpatient Medications Prior to Visit  Medication Sig   Cetirizine HCl (ZYRTEC  ALLERGY PO) Take 10 mg by mouth daily.   fluticasone (FLONASE) 50 MCG/ACT nasal spray Place 2 sprays into both nostrils daily.   meloxicam  (MOBIC ) 15 MG tablet Take 1 tablet (15 mg total) by mouth daily.   albuterol  (VENTOLIN  HFA) 108 (90 Base) MCG/ACT inhaler Inhale 2 puffs into the lungs every 6 (six) hours as needed for wheezing or shortness of breath. (Patient not taking: Reported on 11/19/2023)   Cholecalciferol (VITAMIN D3) 250 MCG (10000 UT) capsule Take 1 capsule (10,000 Units total) by mouth daily. (Patient not taking: Reported on 11/19/2023)   SPIKEVAX syringe  (Patient not taking: Reported on 11/19/2023)   tiotropium (SPIRIVA  HANDIHALER) 18 MCG inhalation capsule Place 1 capsule (18 mcg total) into inhaler and inhale daily. (Patient not taking: Reported on 11/19/2023)   No facility-administered medications prior to visit.    Review of Systems  Last CBC Lab Results  Component Value Date   WBC 5.6 08/14/2021   HGB 14.2 08/14/2021   HCT 41.6 08/14/2021   MCV 80 08/14/2021   MCH 27.4 08/14/2021   RDW 14.5 08/14/2021   PLT 262 08/14/2021   Last metabolic panel Lab Results  Component Value Date   GLUCOSE 97 08/14/2021   NA 145 (H) 08/14/2021   K 4.1 08/14/2021   CL 108 (H) 08/14/2021   CO2 25 12/17/2016   BUN 13 08/14/2021   CREATININE 0.71 08/14/2021   EGFR 94  08/14/2021   CALCIUM 9.4 08/14/2021   PHOS 3.6 08/14/2021   PROT 6.6 08/14/2021   ALBUMIN 4.1 08/14/2021   LABGLOB 2.5 08/14/2021   AGRATIO 1.6 08/14/2021   BILITOT 0.4 08/14/2021   ALKPHOS 96 08/14/2021   AST 18 08/14/2021   ALT 16 08/14/2021   ANIONGAP 7 12/17/2016   Last lipids Lab Results  Component Value Date   CHOL 189 08/14/2021   HDL 51 08/14/2021   LDLCALC 121 (H) 08/14/2021   TRIG 91 08/14/2021   CHOLHDL 3.7 08/14/2021   Last hemoglobin A1c Lab Results  Component Value Date   HGBA1C 4.9 08/14/2021   Last thyroid functions Lab Results  Component Value Date   TSH 0.683 08/14/2021   T4TOTAL  9.3 08/14/2021        Objective    BP 138/82 (Cuff Size: Large)   Pulse 77   Ht 5\' 7"  (1.702 m)   Wt 233 lb (105.7 kg)   SpO2 96%   BMI 36.49 kg/m  BP Readings from Last 3 Encounters:  11/19/23 138/82  10/06/23 127/69  07/16/22 (!) 145/63   Wt Readings from Last 3 Encounters:  11/19/23 233 lb (105.7 kg)  10/06/23 229 lb 12.8 oz (104.2 kg)  07/16/22 225 lb 12.8 oz (102.4 kg)       Physical Exam  Physical Exam VITALS: BP- 138/82 CARD: RRR without murmurs  CHEST: Lungs clear to auscultation, no wheezing. MUSCULOSKELETAL: Tenderness in medial posterior right knee, no redness, no swelling noted in area of tenderness     No results found for any visits on 11/19/23.   Assessment & Plan     Problem List Items Addressed This Visit       Other   Avitaminosis D   Chronic  Last vitamin D  Lab Results  Component Value Date   VD25OH 13.7 (L) 01/29/2022  Continue supplementation  Will check vitamin D  levles today        Relevant Orders   VITAMIN D  25 Hydroxy (Vit-D Deficiency, Fractures)   Other Visit Diagnoses       Posterior right knee pain    -  Primary   Relevant Medications   traMADol (ULTRAM) 50 MG tablet     Stopped smoking with greater than 30 pack year history       Relevant Orders   Ambulatory Referral for Lung Cancer Screening [REF832]     Elevated blood pressure reading       Relevant Orders   CMP14+EGFR   TSH+T4F+T3Free     Postmenopausal estrogen deficiency       Relevant Orders   DG Bone Density       Assessment & Plan Right knee pain Right knee pain with tenderness in the medial posterior area, below the popliteal fossa, persisting for one month. Previous imaging suggested a complex cystic lesion, possibly a Baker's cyst. No evidence of deep vein thrombosis or infectious arthritis. Pain is severe and not adequately managed with meloxicam . - Prescribe tramadol 50 mg every 8 hours for pain management - Follow up with orthopedics on June  10 for MRI results and further management  Elevated blood pressure  Blood pressure recorded at 158/81, potentially influenced by knee pain. No current antihypertensive medication. Plan to evaluate further with lab tests to rule out secondary causes such as thyroid dysfunction. - Check complete metabolic panel to assess liver and kidney function - Check thyroid function tests - Recheck blood pressure  General Health Maintenance 45 pack-year smoking history, quit  in 2020. Last colonoscopy in 2023. Vitamin D  levels need assessment and possible supplementation. Due for bone density scan. Lung cancer screening recommended due to smoking history. - Refer to pulmonology for low-dose chest CT for lung cancer screening - Order bone density scan for osteoporosis screening - Check vitamin D  levels and consider high-dose vitamin D  supplementation if needed     Return in about 3 months (around 02/19/2024) for AWV, CPE.         Mimi Alt, MD  [facility] [phone] (phone) [phone] (fax)  [facility]

## 2023-11-21 LAB — CMP14+EGFR
ALT: 18 IU/L (ref 0–32)
AST: 19 IU/L (ref 0–40)
Albumin: 4.2 g/dL (ref 3.9–4.9)
Alkaline Phosphatase: 99 IU/L (ref 44–121)
BUN/Creatinine Ratio: 18 (ref 12–28)
BUN: 14 mg/dL (ref 8–27)
Bilirubin Total: 0.2 mg/dL (ref 0.0–1.2)
CO2: 24 mmol/L (ref 20–29)
Calcium: 9.3 mg/dL (ref 8.7–10.3)
Chloride: 104 mmol/L (ref 96–106)
Creatinine, Ser: 0.76 mg/dL (ref 0.57–1.00)
Globulin, Total: 2.6 g/dL (ref 1.5–4.5)
Glucose: 87 mg/dL (ref 70–99)
Potassium: 4.2 mmol/L (ref 3.5–5.2)
Sodium: 141 mmol/L (ref 134–144)
Total Protein: 6.8 g/dL (ref 6.0–8.5)
eGFR: 85 mL/min/{1.73_m2} (ref >59)

## 2023-11-21 LAB — TSH+T4F+T3FREE
Free T4: 1.26 ng/dL (ref 0.82–1.77)
T3, Free: 4.7 pg/mL — ABNORMAL HIGH (ref 2.0–4.4)
TSH: 1.34 u[IU]/mL (ref 0.450–4.500)

## 2023-11-21 LAB — VITAMIN D 25 HYDROXY (VIT D DEFICIENCY, FRACTURES): Vit D, 25-Hydroxy: 21.5 ng/mL — ABNORMAL LOW (ref 30.0–100.0)

## 2023-11-23 ENCOUNTER — Ambulatory Visit: Payer: Self-pay | Admitting: Family Medicine

## 2024-02-19 ENCOUNTER — Ambulatory Visit: Admitting: Family Medicine

## 2024-02-19 ENCOUNTER — Encounter: Payer: Self-pay | Admitting: Family Medicine

## 2024-02-19 VITALS — BP 134/61 | HR 74 | Resp 16 | Ht 67.5 in | Wt 237.3 lb

## 2024-02-19 DIAGNOSIS — Z6836 Body mass index (BMI) 36.0-36.9, adult: Secondary | ICD-10-CM

## 2024-02-19 DIAGNOSIS — E559 Vitamin D deficiency, unspecified: Secondary | ICD-10-CM | POA: Diagnosis not present

## 2024-02-19 DIAGNOSIS — Z0001 Encounter for general adult medical examination with abnormal findings: Secondary | ICD-10-CM

## 2024-02-19 DIAGNOSIS — H5203 Hypermetropia, bilateral: Secondary | ICD-10-CM

## 2024-02-19 DIAGNOSIS — Z23 Encounter for immunization: Secondary | ICD-10-CM

## 2024-02-19 DIAGNOSIS — Z87891 Personal history of nicotine dependence: Secondary | ICD-10-CM

## 2024-02-19 DIAGNOSIS — E66812 Obesity, class 2: Secondary | ICD-10-CM

## 2024-02-19 DIAGNOSIS — Z13220 Encounter for screening for lipoid disorders: Secondary | ICD-10-CM

## 2024-02-19 DIAGNOSIS — Z780 Asymptomatic menopausal state: Secondary | ICD-10-CM

## 2024-02-19 DIAGNOSIS — Z0000 Encounter for general adult medical examination without abnormal findings: Secondary | ICD-10-CM

## 2024-02-19 NOTE — Patient Instructions (Addendum)
 [facility] [address] [place],  [place]  [address] Main: [phone]    A referral has been placed on your behalf for lung cancer and eye exam. Our referral coordination team or the office you will be visiting will contact you within the next 2 weeks.  If you have not received a phone call within 10 business days please let us  know so that we can check into this for you.    It was a pleasure to see you today!  Thank you for choosing [facility] for your primary care.   Today you were seen for your annual physical  Please review the attached information regarding helpful preventive health topics.   To keep you healthy, please keep in mind the following health maintenance items that you are due for:   Health Maintenance Due  Topic Date Due   Pneumococcal Vaccine: 50+ Years (1 of 1 - PCV) Never done   Zoster Vaccines- Shingrix (1 of 2) Never done   Lung Cancer Screening  08/11/2020   DEXA SCAN  Never done   COVID-19 Vaccine (6 - 2024-25 season) 02/15/2024     Best Wishes,   Dr. Lang

## 2024-02-19 NOTE — Progress Notes (Signed)
 Subjective:   Cindy Yang is a 68 y.o. female who presents for Medicare Annual (Subsequent) preventive examination.  Visit Complete: In person  Patient Medicare AWV questionnaire was completed by the patient on 02/21/24; I have confirmed that all information answered by patient is correct and no changes since this date.  Cardiac Risk Factors include: advanced age (>23men, >74 women);obesity (BMI >30kg/m2)  Discussed the use of AI scribe software for clinical note transcription with the patient, who gave verbal consent to proceed.  History of Present Illness Cindy Yang is a 68 year old female who presents for an annual wellness visit.  She feels generally well but experiences mild fatigue, which she attributes to frequent nocturnal awakenings.  She has a history of smoking, having started in high school and continued until 2021. Her last lung cancer screening was in 2021.  She is up to date with her mammogram, having had it last October, and her colonoscopy, which was negative in 2020.  She has not had an eye exam in a long time and needs reading glasses for close-up work.  She plans to receive her COVID vaccine at a pharmacy and will return for her pneumonia vaccine. She is also considering the RSV and shingles vaccines.  She is not currently on any medications and does not take opioids.      Objective:    Today's Vitals   02/19/24 1424 02/19/24 1439 02/19/24 1507  BP: (!) 151/67 (!) 144/58 134/61  Pulse: 72 74 74  Resp: 16    SpO2: 96%    Weight: 237 lb 4.8 oz (107.6 kg)    Height: 5' 7.5 (1.715 m)     Body mass index is 36.62 kg/m.     02/19/2024    2:34 PM 05/09/2019    9:01 AM 12/16/2016   10:26 AM 12/10/2016    9:25 AM  Advanced Directives  Does Patient Have a Medical Advance Directive? No No No  No   Would patient like information on creating a medical advance directive? No - Patient declined No - Patient declined No - Patient declined       Data  saved with a previous flowsheet row definition    Current Medications (verified) Outpatient Encounter Medications as of 02/19/2024  Medication Sig   Cetirizine HCl (ZYRTEC ALLERGY PO) Take 10 mg by mouth daily.   fluticasone (FLONASE) 50 MCG/ACT nasal spray Place 2 sprays into both nostrils daily.   meloxicam  (MOBIC ) 15 MG tablet Take 1 tablet (15 mg total) by mouth daily.   albuterol  (VENTOLIN  HFA) 108 (90 Base) MCG/ACT inhaler Inhale 2 puffs into the lungs every 6 (six) hours as needed for wheezing or shortness of breath. (Patient not taking: Reported on 11/19/2023)   Cholecalciferol (VITAMIN D3) 250 MCG (10000 UT) capsule Take 1 capsule (10,000 Units total) by mouth daily. (Patient not taking: Reported on 11/19/2023)   SPIKEVAX syringe  (Patient not taking: Reported on 11/19/2023)   tiotropium (SPIRIVA  HANDIHALER) 18 MCG inhalation capsule Place 1 capsule (18 mcg total) into inhaler and inhale daily. (Patient not taking: Reported on 11/19/2023)   No facility-administered encounter medications on file as of 02/19/2024.    Allergies (verified) Patient has no known allergies.   History: Past Medical History:  Diagnosis Date   Dyspnea    Encounter for screening colonoscopy    History of prediabetes 10/18/2015   Shoulder tendonitis, left 10/07/2016   Past Surgical History:  Procedure Laterality Date   APPENDECTOMY  COLONOSCOPY WITH PROPOFOL  N/A 05/09/2019   Procedure: COLONOSCOPY WITH PROPOFOL ;  Surgeon: Unk Corinn Skiff, MD;  Location: [facility] ENDOSCOPY;  Service: Gastroenterology;  Laterality: N/A;   COLONOSCOPY WITH PROPOFOL  N/A 05/15/2022   Procedure: COLONOSCOPY WITH PROPOFOL ;  Surgeon: Unk Corinn Skiff, MD;  Location: [facility] ENDOSCOPY;  Service: Gastroenterology;  Laterality: N/A;   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   Procedure: TOTAL HIP ARTHROPLASTY ANTERIOR APPROACH;  Surgeon: Kathlynn Sharper, MD;  Location: [facility] ORS;  Service: Orthopedics;  Laterality: Left;   TUBAL LIGATION      Family History  Problem Relation Age of Onset   Dementia Mother    Lung cancer Mother    Lung cancer Father    Valvular heart disease Father    Hypertension Brother    Breast cancer Maternal Aunt    Lung cancer Paternal Uncle    Stroke Maternal Grandmother    Social History   Socioeconomic History   Marital status: Married    Spouse name: Not on file   Number of children: Not on file   Years of education: Not on file   Highest education level: Not on file  Occupational History   Not on file  Tobacco Use   Smoking status: Former    Current packs/day: 0.00    Average packs/day: 1 pack/day for 45.1 years (45.1 ttl pk-yrs)    Types: Cigarettes    Start date: 09/25/1973    Quit date: 10/17/2018    Years since quitting: 5.3   Smokeless tobacco: Never  Vaping Use   Vaping status: Never Used  Substance and Sexual Activity   Alcohol use: No   Drug use: No   Sexual activity: Yes  Other Topics Concern   Not on file  Social History Narrative   Not on file   Social Drivers of Health   Financial Resource Strain: Low Risk  (02/19/2024)   Overall Financial Resource Strain (CARDIA)    Difficulty of Paying Living Expenses: Not hard at all  Food Insecurity: No Food Insecurity (02/19/2024)   Hunger Vital Sign    Worried About Running Out of Food in the Last Year: Never true    Ran Out of Food in the Last Year: Never true  Transportation Needs: No Transportation Needs (02/19/2024)   PRAPARE - Administrator, Civil Service (Medical): No    Lack of Transportation (Non-Medical): No  Physical Activity: Inactive (02/19/2024)   Exercise Vital Sign    Days of Exercise per Week: 0 days    Minutes of Exercise per Session: Not on file  Stress: No Stress Concern Present (02/19/2024)   [employer] of Occupational Health - Occupational Stress Questionnaire    Feeling of Stress: Not at all  Social Connections: Moderately Integrated (02/19/2024)   Social Connection and Isolation Panel     Frequency of Communication with Friends and Family: More than three times a week    Frequency of Social Gatherings with Friends and Family: More than three times a week    Attends Religious Services: More than 4 times per year    Active Member of [employer] or Organizations: No    Attends Engineer, structural: Not on file    Marital Status: Married    Tobacco Counseling Counseling given: Not Answered   Clinical Intake:  Pre-visit preparation completed: Yes  Pain : No/denies pain     BMI - recorded: 36.62 Nutritional Status: BMI > 30  Obese Nutritional Risks: None Diabetes: No  How often  do you need to have someone help you when you read instructions, pamphlets, or other written materials from your doctor or pharmacy?: 1 - Never What is the last grade level you completed in school?: 12th grade  Interpreter Needed?: No      Activities of Daily Living    02/19/2024    2:26 PM  In your present state of health, do you have any difficulty performing the following activities:  Hearing? 0  Vision? 0  Difficulty concentrating or making decisions? 0  Walking or climbing stairs? 1  Comment a little  Dressing or bathing? 0  Doing errands, shopping? 0  Preparing Food and eating ? N  Using the Toilet? N  In the past six months, have you accidently leaked urine? N  Do you have problems with loss of bowel control? N  Managing your Medications? N  Managing your Finances? N  Housekeeping or managing your Housekeeping? N    Patient Care Team: Sharma Coyer, MD as PCP - General (Family Medicine)   Physical Exam Vitals reviewed.  Constitutional:      General: She is not in acute distress.    Appearance: Normal appearance. She is not ill-appearing.  Cardiovascular:     Rate and Rhythm: Normal rate and regular rhythm.  Pulmonary:     Effort: Pulmonary effort is normal. No respiratory distress.     Breath sounds: No wheezing, rhonchi or rales.   Neurological:     Mental Status: She is alert and oriented to person, place, and time.  Psychiatric:        Mood and Affect: Mood normal.        Behavior: Behavior normal.      Indicate any recent Medical Services you may have received from other than [facility] providers in the past year (date may be approximate).     Assessment:   This is a routine wellness examination for Ismerai.  Hearing/Vision screen Vision Screening - Comments:: Last eye exam-several years ago   Goals Addressed   None    Depression Screen    02/19/2024    2:25 PM 10/06/2023    4:15 PM 06/30/2022    3:19 PM 05/16/2022    2:01 PM  PHQ 2/9 Scores  PHQ - 2 Score 0 0 0 0  PHQ- 9 Score   0 0    Fall Risk    02/19/2024    2:25 PM 07/16/2022    9:55 AM 06/30/2022    3:18 PM 05/16/2022    2:00 PM  Fall Risk   Falls in the past year? 0  0   Number falls in past yr: 0  0 0  Injury with Fall? 0  0 0  Risk for fall due to : No Fall Risks No Fall Risks No Fall Risks   Follow up  Falls evaluation completed      MEDICARE RISK AT HOME: Medicare Risk at Home Any stairs in or around the home?: No Home free of loose throw rugs in walkways, pet beds, electrical cords, etc?: Yes Adequate lighting in your home to reduce risk of falls?: Yes Life alert?: No Use of a cane, walker or w/c?: No Grab bars in the bathroom?: Yes Shower chair or bench in shower?: Yes Elevated toilet seat or a handicapped toilet?: No  TIMED UP AND GO:  Was the test performed?  No    Cognitive Function:        02/19/2024    2:31 PM  6CIT Screen  What Year? 0 points  What month? 0 points  What time? 0 points  Count back from 20 0 points  Months in reverse 0 points  Repeat phrase 4 points  Total Score 4 points    Immunizations Immunization History  Administered Date(s) Administered    Astrazeneca Covid-19 Vaccine, Pf, 0.5 Ml Non Us   05/02/2022   Fluad Trivalent(High Dose 65+) 03/26/2023   INFLUENZA, HIGH DOSE SEASONAL PF  02/19/2024   Influenza,inj,Quad PF,6+ Mos 03/29/2019, 03/13/2021, 03/24/2022   Influenza-Unspecified 04/14/2017, 04/19/2018, 02/29/2020   Moderna Covid-19 Fall Seasonal Vaccine 71yrs & older 04/02/2022, 06/04/2023   PFIZER(Purple Top)SARS-COV-2 Vaccination 08/27/2019, 09/20/2019, 04/27/2020   Tdap 04/18/2019    TDAP status: Up to date  Flu Vaccine status: Completed at today's visit  Pneumococcal vaccine status: Due, Education has been provided regarding the importance of this vaccine. Advised may receive this vaccine at local pharmacy or Health Dept. Aware to provide a copy of the vaccination record if obtained from local pharmacy or Health Dept. Verbalized acceptance and understanding.  Covid-19 vaccine status: Declined, Education has been provided regarding the importance of this vaccine but patient still declined. Advised may receive this vaccine at local pharmacy or Health Dept.or vaccine clinic. Aware to provide a copy of the vaccination record if obtained from local pharmacy or Health Dept. Verbalized acceptance and understanding.  Qualifies for Shingles Vaccine? Yes   Zostavax completed No   Shingrix Completed?: No.    Education has been provided regarding the importance of this vaccine. Patient has been advised to call insurance company to determine out of pocket expense if they have not yet received this vaccine. Advised may also receive vaccine at local pharmacy or Health Dept. Verbalized acceptance and understanding.  Screening Tests Health Maintenance  Topic Date Due   Pneumococcal Vaccine: 50+ Years (1 of 1 - PCV) Never done   Zoster Vaccines- Shingrix (1 of 2) Never done   Lung Cancer Screening  08/11/2020   DEXA SCAN  Never done   COVID-19 Vaccine (6 - 2024-25 season) 02/15/2024   MAMMOGRAM  04/06/2024   Medicare Annual Wellness (AWV)  02/18/2025   Colonoscopy  05/15/2025   DTaP/Tdap/Td (2 - Td or Tdap) 04/17/2029   Influenza Vaccine  Completed   Hepatitis C Screening   Completed   HPV VACCINES  Aged Out   Meningococcal B Vaccine  Aged Out    Health Maintenance  Health Maintenance Due  Topic Date Due   Pneumococcal Vaccine: 50+ Years (1 of 1 - PCV) Never done   Zoster Vaccines- Shingrix (1 of 2) Never done   Lung Cancer Screening  08/11/2020   DEXA SCAN  Never done   COVID-19 Vaccine (6 - 2024-25 season) 02/15/2024    Colorectal cancer screening: Type of screening: Colonoscopy. Completed 2023. Repeat every 3 years  Mammogram status: Completed 03/2023. Repeat every year  Bone Density status: Ordered  . Pt provided with contact info and advised to call to schedule appt.  Lung Cancer Screening: (Low Dose CT Chest recommended if Age 51-80 years, 20 pack-year currently smoking OR have quit w/in 15years.) does qualify.   Lung Cancer Screening Referral: referral submitted today   Additional Screening:  Hepatitis C Screening: does qualify; Completed negative results   Vision Screening: Recommended annual ophthalmology exams for early detection of glaucoma and other disorders of the eye. Is the patient up to date with their annual eye exam?  No  Who is the provider or what is the name of the office  in which the patient attends annual eye exams? Not established with a provider  If pt is not established with a provider, would they like to be referred to a provider to establish care? Yes .   Dental Screening: Recommended annual dental exams for proper oral hygiene  Diabetic Foot Exam: n/a      Plan:     I have personally reviewed and noted the following in the patient's chart:   Medical and social history Use of alcohol, tobacco or illicit drugs  Current medications and supplements including opioid prescriptions. Patient is not currently taking opioid prescriptions. Functional ability and status Nutritional status Physical activity Advanced directives List of other physicians Hospitalizations, surgeries, and ER visits in previous 12  months Vitals Screenings to include cognitive, depression, and falls Referrals and appointments  In addition, I have reviewed and discussed with patient certain preventive protocols, quality metrics, and best practice recommendations. A written personalized care plan for preventive services as well as general preventive health recommendations were provided to patient.    Assessment and Plan Assessment & Plan Adult Wellness Visit Annual wellness visit conducted. Blood pressure elevated at 144/58. Up to date on mammogram and colonoscopy screenings. Eye exam is overdue. - Administered influenza vaccine today - Recommend COVID vaccine at pharmacy - Recommend pneumococcal vaccine - Recommend Shingrix vaccine - Recommend lung cancer screening with low dose chest CT - Recommend bone density scan - Check vitamin D  levels - Check for anemia - Check cholesterol levels - Recommend annual eye exam  Obesity, class 2 Obesity class 2 noted.  Elevated blood pressure Blood pressure elevated at 144/58.  Personal history of nicotine dependence Long history of smoking from age 89 to 2021. - Recommend lung cancer screening based on smoking history  Hypermetropia (farsightedness) Reports difficulty with near vision, requiring reading glasses. No recent eye exam conducted. - Refer to ophthalmology for eye exam  Suspected vitamin D  deficiency Vitamin D  deficiency suspected. - Check vitamin D  levels    Rockie Agent, MD   02/21/2024   After Visit Summary: (In Person-Printed) AVS printed and given to the patient

## 2024-02-20 LAB — CMP14+EGFR
ALT: 17 IU/L (ref 0–32)
AST: 17 IU/L (ref 0–40)
Albumin: 3.8 g/dL — ABNORMAL LOW (ref 3.9–4.9)
Alkaline Phosphatase: 84 IU/L (ref 44–121)
BUN/Creatinine Ratio: 21 (ref 12–28)
BUN: 20 mg/dL (ref 8–27)
Bilirubin Total: <0.2 mg/dL (ref 0.0–1.2)
CO2: 21 mmol/L (ref 20–29)
Calcium: 9.4 mg/dL (ref 8.7–10.3)
Chloride: 108 mmol/L — ABNORMAL HIGH (ref 96–106)
Creatinine, Ser: 0.96 mg/dL (ref 0.57–1.00)
Globulin, Total: 2.3 g/dL (ref 1.5–4.5)
Glucose: 96 mg/dL (ref 70–99)
Potassium: 4.1 mmol/L (ref 3.5–5.2)
Sodium: 143 mmol/L (ref 134–144)
Total Protein: 6.1 g/dL (ref 6.0–8.5)
eGFR: 64 mL/min/1.73 (ref >59)

## 2024-02-20 LAB — CBC
Hematocrit: 42 % (ref 34.0–46.6)
Hemoglobin: 13.4 g/dL (ref 11.1–15.9)
MCH: 26.8 pg (ref 26.6–33.0)
MCHC: 31.9 g/dL (ref 31.5–35.7)
MCV: 84 fL (ref 79–97)
Platelets: 234 x10E3/uL (ref 150–450)
RBC: 5 x10E6/uL (ref 3.77–5.28)
RDW: 15.1 % (ref 11.7–15.4)
WBC: 7.4 x10E3/uL (ref 3.4–10.8)

## 2024-02-20 LAB — HEMOGLOBIN A1C
Est. average glucose Bld gHb Est-mCnc: 97 mg/dL
Hgb A1c MFr Bld: 5 % (ref 4.8–5.6)

## 2024-02-20 LAB — LIPID PANEL
Chol/HDL Ratio: 3.5 ratio (ref 0.0–4.4)
Cholesterol, Total: 183 mg/dL (ref 100–199)
HDL: 53 mg/dL (ref >39)
LDL Chol Calc (NIH): 110 mg/dL — ABNORMAL HIGH (ref 0–99)
Triglycerides: 111 mg/dL (ref 0–149)
VLDL Cholesterol Cal: 20 mg/dL (ref 5–40)

## 2024-02-20 LAB — VITAMIN D 25 HYDROXY (VIT D DEFICIENCY, FRACTURES): Vit D, 25-Hydroxy: 20 ng/mL — ABNORMAL LOW (ref 30.0–100.0)

## 2024-02-24 ENCOUNTER — Ambulatory Visit: Payer: Self-pay | Admitting: Family Medicine

## 2024-02-29 ENCOUNTER — Telehealth: Payer: Self-pay | Admitting: Acute Care

## 2024-02-29 ENCOUNTER — Other Ambulatory Visit: Payer: Self-pay

## 2024-02-29 DIAGNOSIS — Z87891 Personal history of nicotine dependence: Secondary | ICD-10-CM

## 2024-02-29 DIAGNOSIS — Z122 Encounter for screening for malignant neoplasm of respiratory organs: Secondary | ICD-10-CM

## 2024-02-29 NOTE — Telephone Encounter (Signed)
 Lung Cancer Screening Narrative/Criteria Questionnaire (Cigarette Smokers Only- No Cigars/Pipes/vapes)   Cindy Yang   SDMV:(after 3p)                                           08/09/1955               LDCT: Tues/Thurs    68 y.o.   Phone: [phone]  Lung Screening Narrative (confirm age 39-77 yrs Medicare / 50-80 yrs Private pay insurance)   Insurance information:UHC   Referring Provider:Simmons-Robinson   This screening involves an initial phone call with a team member from our program. It is called a shared decision making visit. The initial meeting is required by insurance and Medicare to make sure you understand the program. This appointment takes about 15-20 minutes to complete. The CT scan will completed at a separate date/time. This scan takes about 5-10 minutes to complete and you may eat and drink before and after the scan.  Criteria questions for Lung Cancer Screening:   Are you a current or former smoker? Former Age began smoking: 18y   If you are a former smoker, what year did you quit smoking? 2020   To calculate your smoking history, I need an accurate estimate of how many packs of cigarettes you smoked per day and for how many years. (Not just the number of PPD you are now smoking)   Years smoking 45 x Packs per day 1 = Pack years 45   (at least 20 pack yrs)   (Make sure they understand that we need to know how much they have smoked in the past, not just the number of PPD they are smoking now)  Do you have a personal history of cancer?  No    Do you have a family history of cancer? Yes  (cancer type and and relative) mother/father both lung cancer  Are you coughing up blood?  No  Have you had unexplained weight loss of 15 lbs or more in the last 6 months? No  It looks like you meet all criteria.     Additional information: N/A

## 2024-03-10 ENCOUNTER — Encounter: Payer: Self-pay | Admitting: *Deleted

## 2024-03-10 ENCOUNTER — Ambulatory Visit: Admitting: *Deleted

## 2024-03-10 DIAGNOSIS — Z87891 Personal history of nicotine dependence: Secondary | ICD-10-CM | POA: Diagnosis not present

## 2024-03-10 NOTE — Progress Notes (Signed)
 Virtual Visit via Telephone Note  I connected with Cindy Yang on 03/10/24 at  3:30 PM EDT by telephone and verified that I am speaking with the correct person using two identifiers.  Location: Patient: in home Provider: [address] [place], [place], [address]   Shared Decision Making Visit Lung Cancer Screening Program [phone])   Eligibility: Age 68 y.o. Pack Years Smoking History Calculation 45 (# packs/per year x # years smoked) Recent History of coughing up blood  no Unexplained weight loss? no ( >Than 15 pounds within the last 6 months ) Prior History Lung / other cancer no (Diagnosis within the last 5 years already requiring surveillance chest CT Scans). Smoking Status Former Smoker Former Smokers: Years since quit: 5 years  Quit Date: 10-2018  Visit Components: Discussion included one or more decision making aids. yes Discussion included risk/benefits of screening. yes Discussion included potential follow up diagnostic testing for abnormal scans. yes Discussion included meaning and risk of over diagnosis. yes Discussion included meaning and risk of False Positives. yes Discussion included meaning of total radiation exposure. yes  Counseling Included: Importance of adherence to annual lung cancer LDCT screening. yes Impact of comorbidities on ability to participate in the program. yes Ability and willingness to under diagnostic treatment. yes  Smoking Cessation Counseling: Current Smokers:  Discussed importance of smoking cessation. yes Information about tobacco cessation classes and interventions provided to patient. yes Patient provided with ticket for LDCT Scan. yes Symptomatic Patient. no  Counseling(Intermediate counseling: > three minutes) [id number] Diagnosis Code: Tobacco Use Z72.0 Asymptomatic Patient yes  Counseling (Intermediate counseling: > three minutes counseling) [id number]  Counseled patient 4 minutes regarding tobacco use.   Former Smokers:   Discussed the importance of maintaining cigarette abstinence. yes Diagnosis Code: Personal History of Nicotine Dependence. S12.108 Information about tobacco cessation classes and interventions provided to patient. Yes Patient provided with ticket for LDCT Scan. yes Written Order for Lung Cancer Screening with LDCT placed in Epic. Yes (CT Chest Lung Cancer Screening Low Dose W/O CM) [id number] Z12.2-Screening of respiratory organs Z87.891-Personal history of nicotine dependence   Josette Ranger, RN 03/10/24

## 2024-03-10 NOTE — Patient Instructions (Signed)

## 2024-03-15 ENCOUNTER — Ambulatory Visit
Admission: RE | Admit: 2024-03-15 | Discharge: 2024-03-15 | Disposition: A | Discharge status: Home / Self Care | Source: Ambulatory Visit | Attending: Acute Care | Admitting: Acute Care

## 2024-03-15 DIAGNOSIS — Z122 Encounter for screening for malignant neoplasm of respiratory organs: Secondary | ICD-10-CM | POA: Diagnosis present

## 2024-03-15 DIAGNOSIS — Z87891 Personal history of nicotine dependence: Secondary | ICD-10-CM | POA: Diagnosis present

## 2024-03-21 ENCOUNTER — Telehealth: Payer: Self-pay

## 2024-03-21 ENCOUNTER — Other Ambulatory Visit: Payer: Self-pay

## 2024-03-21 DIAGNOSIS — Z122 Encounter for screening for malignant neoplasm of respiratory organs: Secondary | ICD-10-CM

## 2024-03-21 DIAGNOSIS — Z87891 Personal history of nicotine dependence: Secondary | ICD-10-CM

## 2024-03-21 NOTE — Telephone Encounter (Signed)
 Results reviewed by Ruthell, NP. Recommend annual LCS. Nodule is shrinking. Order placed. Results letter to patient. Results and plan to PCP.

## 2024-04-13 ENCOUNTER — Ambulatory Visit
Admission: RE | Admit: 2024-04-13 | Discharge: 2024-04-13 | Disposition: A | Source: Ambulatory Visit | Attending: Family Medicine

## 2024-04-13 ENCOUNTER — Other Ambulatory Visit: Payer: Self-pay | Admitting: Family Medicine

## 2024-04-13 DIAGNOSIS — Z1231 Encounter for screening mammogram for malignant neoplasm of breast: Secondary | ICD-10-CM

## 2024-04-19 ENCOUNTER — Other Ambulatory Visit: Payer: Self-pay | Admitting: Family Medicine

## 2024-04-19 DIAGNOSIS — R928 Other abnormal and inconclusive findings on diagnostic imaging of breast: Secondary | ICD-10-CM

## 2024-05-03 ENCOUNTER — Ambulatory Visit
Admission: RE | Admit: 2024-05-03 | Discharge: 2024-05-03 | Disposition: A | Source: Ambulatory Visit | Attending: Family Medicine

## 2024-05-03 DIAGNOSIS — R928 Other abnormal and inconclusive findings on diagnostic imaging of breast: Secondary | ICD-10-CM

## 2024-05-09 ENCOUNTER — Encounter: Payer: Self-pay | Admitting: Family Medicine

## 2024-05-09 ENCOUNTER — Ambulatory Visit: Payer: Self-pay | Admitting: Family Medicine

## 2024-05-09 DIAGNOSIS — N6002 Solitary cyst of left breast: Secondary | ICD-10-CM | POA: Insufficient documentation

## 2024-05-13 ENCOUNTER — Telehealth: Payer: Self-pay

## 2024-07-01 NOTE — Telephone Encounter (Signed)
 SABRA

## 2025-02-21 ENCOUNTER — Encounter: Admitting: Family Medicine
